# Patient Record
Sex: Female | Born: 1950
Health system: Southern US, Community
[De-identification: ages and names within clinical notes are randomized; demographics above are authoritative.]

## PROBLEM LIST (undated history)

## (undated) DIAGNOSIS — R011 Cardiac murmur, unspecified: Secondary | ICD-10-CM

## (undated) DIAGNOSIS — Z720 Tobacco use: Secondary | ICD-10-CM

## (undated) DIAGNOSIS — R5383 Other fatigue: Secondary | ICD-10-CM

## (undated) HISTORY — DX: Cardiac murmur, unspecified: R01.1

## (undated) HISTORY — DX: Tobacco use: Z72.0

## (undated) HISTORY — PX: HERNIA REPAIR: SHX51

## (undated) HISTORY — DX: Other fatigue: R53.83

---

## 2013-07-14 ENCOUNTER — Encounter: Payer: Self-pay | Admitting: Cardiovascular Disease

## 2013-07-14 ENCOUNTER — Encounter (INDEPENDENT_AMBULATORY_CARE_PROVIDER_SITE_OTHER): Payer: Self-pay

## 2013-07-14 ENCOUNTER — Ambulatory Visit (INDEPENDENT_AMBULATORY_CARE_PROVIDER_SITE_OTHER): Payer: Medicare Other | Admitting: Cardiovascular Disease

## 2013-07-14 VITALS — BP 135/79 | HR 84 | Ht 60.0 in | Wt 180.0 lb

## 2013-07-14 DIAGNOSIS — F17200 Nicotine dependence, unspecified, uncomplicated: Secondary | ICD-10-CM

## 2013-07-14 DIAGNOSIS — Z720 Tobacco use: Secondary | ICD-10-CM | POA: Insufficient documentation

## 2013-07-14 DIAGNOSIS — R011 Cardiac murmur, unspecified: Secondary | ICD-10-CM

## 2013-07-14 MED ORDER — ASPIRIN EC 81 MG PO TBEC: 81.0000 mg | DELAYED_RELEASE_TABLET | Freq: Every day | ORAL | Status: DC

## 2013-07-14 NOTE — Assessment & Plan Note (Addendum)
The patient has a cardiac murmur suggestive of aortic stenosis which seems to be mild to moderate by physical exam. I recommend an echocardiogram for evaluation. I discussed with her the natural history and management of aortic valve stenosis. I will have her followup with me in 6 months. She does have exertional dyspnea which is likely due to prolonged tobacco use. However, I will consider stress testing in the future given her risk factors for coronary artery disease. I asked her to stop aspirin 81 mg once daily.

## 2013-07-14 NOTE — Assessment & Plan Note (Signed)
I had a prolonged discussion with her about the importance of smoking cessation.

## 2013-07-14 NOTE — Progress Notes (Signed)
Primary care physician: Dr. Luan Pulling  HPI  This is a pleasant 63 year old female who was referred for evaluation of a cardiac murmur. She is not aware of any previous cardiac history and has no chronic medical conditions other than prolonged tobacco use. She smokes 2 packs per day and had been doing so for at least 40 years. She recently is established with Dr. Luan Pulling I have not seen a primary care physician prior to that since 1983.  She was noted to have a cardiac murmur by physical exam. She denies any chest pain, palpitations, dizziness or syncope. She does report prolonged history of exertional dyspnea with no recent worsening. She denies any claudication. She does have family history of coronary artery disease. Her father had myocardial infarction and CABG but she does not know the onset of age.  No Known Allergies   No current outpatient prescriptions on file prior to visit.   No current facility-administered medications on file prior to visit.     Past Medical History  Diagnosis Date  . Tobacco use      Past Surgical History  Procedure Laterality Date  . Hernia repair       Family History  Problem Relation Age of Onset  . Alzheimer's disease Mother   . Heart attack Father      History   Social History  . Marital Status: Married    Spouse Name: N/A    Number of Children: N/A  . Years of Education: N/A   Occupational History  . Not on file.   Social History Main Topics  . Smoking status: Current Every Day Smoker -- 2.00 packs/day for 40 years    Types: Cigarettes  . Smokeless tobacco: Current User    Types: Snuff  . Alcohol Use: No  . Drug Use: No  . Sexual Activity: Not on file   Other Topics Concern  . Not on file   Social History Narrative  . No narrative on file     ROS A 10 point review of system was performed. It is negative other than that mentioned in the history of present illness.   PHYSICAL EXAM   BP 135/79  Pulse 84  Ht 5'  (1.524 m)  Wt 180 lb (81.647 kg)  BMI 35.15 kg/m2 Constitutional: She is oriented to person, place, and time. She appears well-developed and well-nourished. No distress.  HENT: No nasal discharge.  Head: Normocephalic and atraumatic.  Eyes: Pupils are equal and round. No discharge.  Neck: Normal range of motion. Neck supple. No JVD present. No thyromegaly present.  Cardiovascular: Normal rate, regular rhythm, normal heart sounds. Exam reveals no gallop and no friction rub. There is a 3/6 systolic crescendo decrescendo murmur at the aortic area which is mid peaking with mildly diminished S2. Normal carotid upstroke. Pulmonary/Chest: Effort normal and breath sounds normal. No stridor. No respiratory distress. She has no wheezes. She has no rales. She exhibits no tenderness.  Abdominal: Soft. Bowel sounds are normal. She exhibits no distension. There is no tenderness. There is no rebound and no guarding.  Musculoskeletal: Normal range of motion. She exhibits no edema and no tenderness.  Neurological: She is alert and oriented to person, place, and time. Coordination normal.  Skin: Skin is warm and dry. No rash noted. She is not diaphoretic. No erythema. No pallor.  Psychiatric: She has a normal mood and affect. Her behavior is normal. Judgment and thought content normal.     [id number]  Rhythm  WITHIN NORMAL  LIMITS   ASSESSMENT AND PLAN

## 2013-07-14 NOTE — Patient Instructions (Signed)
Your physician has requested that you have an echocardiogram. Echocardiography is a painless test that uses sound waves to create images of your heart. It provides your doctor with information about the size and shape of your heart and how well your heart's chambers and valves are working. This procedure takes approximately one hour. There are no restrictions for this procedure.  Your physician has recommended you make the following change in your medication:  Start Aspirin 81 mg daily   Your physician wants you to follow-up in: 6 months. You will receive a reminder letter in the mail two months in advance. If you don't receive a letter, please call our office to schedule the follow-up appointment.

## 2013-07-16 ENCOUNTER — Ambulatory Visit: Payer: Self-pay | Admitting: Family Medicine

## 2013-08-04 ENCOUNTER — Other Ambulatory Visit (INDEPENDENT_AMBULATORY_CARE_PROVIDER_SITE_OTHER): Payer: Medicare Other

## 2013-08-04 ENCOUNTER — Other Ambulatory Visit: Payer: Self-pay

## 2013-08-04 DIAGNOSIS — R0602 Shortness of breath: Secondary | ICD-10-CM

## 2013-08-04 DIAGNOSIS — R011 Cardiac murmur, unspecified: Secondary | ICD-10-CM

## 2015-08-01 DIAGNOSIS — H2513 Age-related nuclear cataract, bilateral: Secondary | ICD-10-CM | POA: Diagnosis not present

## 2017-02-22 ENCOUNTER — Ambulatory Visit: Payer: Self-pay | Admitting: Family Medicine

## 2017-04-25 ENCOUNTER — Encounter: Payer: Self-pay | Admitting: Family Medicine

## 2017-04-25 ENCOUNTER — Ambulatory Visit (INDEPENDENT_AMBULATORY_CARE_PROVIDER_SITE_OTHER): Payer: Medicare Other | Admitting: Family Medicine

## 2017-04-25 VITALS — BP 140/64 | HR 87 | Temp 98.0°F | Resp 16 | Ht 60.25 in | Wt 172.6 lb

## 2017-04-25 DIAGNOSIS — Z1231 Encounter for screening mammogram for malignant neoplasm of breast: Secondary | ICD-10-CM | POA: Diagnosis not present

## 2017-04-25 DIAGNOSIS — K625 Hemorrhage of anus and rectum: Secondary | ICD-10-CM | POA: Diagnosis not present

## 2017-04-25 DIAGNOSIS — Z1211 Encounter for screening for malignant neoplasm of colon: Secondary | ICD-10-CM | POA: Diagnosis not present

## 2017-04-25 DIAGNOSIS — E669 Obesity, unspecified: Secondary | ICD-10-CM | POA: Diagnosis not present

## 2017-04-25 DIAGNOSIS — I350 Nonrheumatic aortic (valve) stenosis: Secondary | ICD-10-CM

## 2017-04-25 DIAGNOSIS — K644 Residual hemorrhoidal skin tags: Secondary | ICD-10-CM | POA: Diagnosis not present

## 2017-04-25 DIAGNOSIS — R7303 Prediabetes: Secondary | ICD-10-CM

## 2017-04-25 DIAGNOSIS — Z1239 Encounter for other screening for malignant neoplasm of breast: Secondary | ICD-10-CM

## 2017-04-25 DIAGNOSIS — Z720 Tobacco use: Secondary | ICD-10-CM

## 2017-04-25 DIAGNOSIS — R011 Cardiac murmur, unspecified: Secondary | ICD-10-CM | POA: Diagnosis not present

## 2017-04-25 NOTE — Assessment & Plan Note (Signed)
Offered rectal exam today; avoid constipation, drink more water, plenty of fiber

## 2017-04-25 NOTE — Assessment & Plan Note (Signed)
Encouragement given; see AVS 

## 2017-04-25 NOTE — Assessment & Plan Note (Signed)
Encouraged cessation, see AVS

## 2017-04-25 NOTE — Assessment & Plan Note (Signed)
Discussed diagnosis; discussed getting echo; murmur is mild, agree to ask again in six months; patient politely declined getting echo today; she does not want to go back to see the cardiologist right now

## 2017-04-25 NOTE — Patient Instructions (Addendum)
Steps to Quit Smoking Smoking tobacco can be bad for your health. It can also affect almost every organ in your body. Smoking puts you and people around you at risk for many serious long-lasting (chronic) diseases. Quitting smoking is hard, but it is one of the best things that you can do for your health. It is never too late to quit. What are the benefits of quitting smoking? When you quit smoking, you lower your risk for getting serious diseases and conditions. They can include:  Lung cancer or lung disease.  Heart disease.  Stroke.  Heart attack.  Not being able to have children (infertility).  Weak bones (osteoporosis) and broken bones (fractures).  If you have coughing, wheezing, and shortness of breath, those symptoms may get better when you quit. You may also get sick less often. If you are pregnant, quitting smoking can help to lower your chances of having a baby of low birth weight. What can I do to help me quit smoking? Talk with your doctor about what can help you quit smoking. Some things you can do (strategies) include:  Quitting smoking totally, instead of slowly cutting back how much you smoke over a period of time.  Going to in-person counseling. You are more likely to quit if you go to many counseling sessions.  Using resources and support systems, such as: ? Online chats with a counselor. ? Phone quitlines. ? Printed self-help materials. ? Support groups or group counseling. ? Text messaging programs. ? Mobile phone apps or applications.  Taking medicines. Some of these medicines may have nicotine in them. If you are pregnant or breastfeeding, do not take any medicines to quit smoking unless your doctor says it is okay. Talk with your doctor about counseling or other things that can help you.  Talk with your doctor about using more than one strategy at the same time, such as taking medicines while you are also going to in-person counseling. This can help make  quitting easier. What things can I do to make it easier to quit? Quitting smoking might feel very hard at first, but there is a lot that you can do to make it easier. Take these steps:  Talk to your family and friends. Ask them to support and encourage you.  Call phone quitlines, reach out to support groups, or work with a counselor.  Ask people who smoke to not smoke around you.  Avoid places that make you want (trigger) to smoke, such as: ? Bars. ? Parties. ? Smoke-break areas at work.  Spend time with people who do not smoke.  Lower the stress in your life. Stress can make you want to smoke. Try these things to help your stress: ? Getting regular exercise. ? Deep-breathing exercises. ? Yoga. ? Meditating. ? Doing a body scan. To do this, close your eyes, focus on one area of your body at a time from head to toe, and notice which parts of your body are tense. Try to relax the muscles in those areas.  Download or buy apps on your mobile phone or tablet that can help you stick to your quit plan. There are many free apps, such as QuitGuide from the CDC (Centers for Disease Control and Prevention). You can find more support from smokefree.gov and other websites.  This information is not intended to replace advice given to you by your health care provider. Make sure you discuss any questions you have with your health care provider. Document Released: 11/25/2008 Document   Revised: 09/27/2015 Document Reviewed: 06/15/2014 Elsevier Interactive Patient Education  2018 [place] Risks of Smoking Smoking cigarettes is very bad for your health. Tobacco smoke has over 200 known poisons in it. It contains the poisonous gases nitrogen oxide and carbon monoxide. There are over 60 chemicals in tobacco smoke that cause cancer. Smoking is difficult to quit because a chemical in tobacco, called nicotine, causes addiction or dependence. When you smoke and inhale, nicotine is absorbed rapidly  into the bloodstream through your lungs. Both inhaled and non-inhaled nicotine may be addictive. What are the risks of cigarette smoke? Cigarette smokers have an increased risk of many serious medical problems, including:  Lung cancer.  Lung disease, such as pneumonia, bronchitis, and emphysema.  Chest pain (angina) and heart attack because the heart is not getting enough oxygen.  Heart disease and peripheral blood vessel disease.  High blood pressure (hypertension).  Stroke.  Oral cancer, including cancer of the lip, mouth, or voice box.  Bladder cancer.  Pancreatic cancer.  Cervical cancer.  Pregnancy complications, including premature birth.  Stillbirths and smaller newborn babies, birth defects, and genetic damage to sperm.  Early menopause.  Lower estrogen level for women.  Infertility.  Facial wrinkles.  Blindness.  Increased risk of broken bones (fractures).  Senile dementia.  Stomach ulcers and internal bleeding.  Delayed wound healing and increased risk of complications during surgery.  Even smoking lightly shortens your life expectancy by several years.  Because of secondhand smoke exposure, children of smokers have an increased risk of the following:  Sudden infant death syndrome (SIDS).  Respiratory infections.  Lung cancer.  Heart disease.  Ear infections.  What are the benefits of quitting? There are many health benefits of quitting smoking. Here are some of them:  Within days of quitting smoking, your risk of having a heart attack decreases, your blood flow improves, and your lung capacity improves. Blood pressure, pulse rate, and breathing patterns start returning to normal soon after quitting.  Within months, your lungs may clear up completely.  Quitting for 10 years reduces your risk of developing lung cancer and heart disease to almost that of a nonsmoker.  People who quit may see an improvement in their overall quality of  life.  How do I quit smoking? Smoking is an addiction with both physical and psychological effects, and longtime habits can be hard to change. Your health care provider can recommend:  Programs and community resources, which may include group support, education, or talk therapy.  Prescription medicines to help reduce cravings.  Nicotine replacement products, such as patches, gum, and nasal sprays. Use these products only as directed. Do not replace cigarette smoking with electronic cigarettes, which are commonly called e-cigarettes. The safety of e-cigarettes is not known, and some may contain harmful chemicals.  A combination of two or more of these methods.  Where to find more information:  American Lung Association: www.lung.org  American Cancer Society: www.cancer.org Summary  Smoking cigarettes is very bad for your health. Cigarette smokers have an increased risk of many serious medical problems, including several cancers, heart disease, and stroke.  Smoking is an addiction with both physical and psychological effects, and longtime habits can be hard to change.  By stopping right away, you can greatly reduce the risk of medical problems for you and your family.  To help you quit smoking, your health care provider can recommend programs, community resources, prescription medicines, and nicotine replacement products such as patches, gum, and nasal  sprays. This information is not intended to replace advice given to you by your health care provider. Make sure you discuss any questions you have with your health care provider. Document Released: 03/08/2004 Document Revised: 02/03/2016 Document Reviewed: 02/03/2016 Elsevier Interactive Patient Education  2017 [place] out the information at [employer].org entitled "Nutrition for Weight Loss: What You Need to Know about Fad Diets" Try to lose between 1-2 pounds per week by taking in fewer calories and burning off more  calories You can succeed by limiting portions, limiting foods dense in calories and fat, becoming more active, and drinking 8 glasses of water a day (64 ounces) Don't skip meals, especially breakfast, as skipping meals may alter your metabolism Do not use over-the-counter weight loss pills or gimmicks that claim rapid weight loss A healthy BMI (or body mass index) is between 18.5 and 24.9 You can calculate your ideal BMI at the [place] website ClubMonet[employer].fr Try to follow the DASH guidelines (DASH stands for Dietary Approaches to Stop Hypertension). Try to limit the sodium in your diet to no more than 1,500mg  of sodium per day. Certainly try to not exceed 2,000 mg per day at the very most. Do not add salt when cooking or at the table.  Check the sodium amount on labels when shopping, and choose items lower in sodium when given a choice. Avoid or limit foods that already contain a lot of sodium. Eat a diet rich in fruits and vegetables and whole grains, and try to lose weight if overweight or obese

## 2017-04-25 NOTE — Progress Notes (Signed)
BP 140/64   Pulse 87   Temp 98 F (36.7 C) (Oral)   Resp 16   Ht 5' 0.25" (1.53 m)   Wt 172 lb 9.6 oz (78.3 kg)   LMP  (LMP Unknown)   SpO2 96%   BMI 33.43 kg/m    Subjective:    Patient ID: Megan Crawford, female    DOB: 20-Jul-1950, 67 y.o.   MRN: [id number]  HPI: Megan Crawford is a 67 y.o. female  Chief Complaint  Patient presents with  . New Patient (Initial Visit)  . Establish Care    HPI Patient is here to establish care No recent doctor visits or ER visits Her medicine list was reviewed; not even taking aspirin; recommended by Dr. Fletcher Anon, cardiologist She had a heart murmur, primary sent her to cardiologist  Never had colon cancer screening; she has blood in her stools, but says it is from a hemorrhoid because she can feel it there; she usually goes every day after her coffee; if she eats something different, that changes her bowel habits; blood is bright right and just when wiping; none in the commode; stool is regular chocolate brown  Tobacco abuse; 2 ppd for many years; quit for 4 years, then just picked back up with girls at work; she is thinking about quitting; she is buying 2 packs at a time instead of a carton at a time; first cig in the morning with coffee; first cig is in 5 minutes after waking up; last cig maybe 15-30 minutes before going to bed; no smoking in the middle of the night  No hx of high cholesterol; her A1c was borderline, then lost weight and numbers improved  Obesity; she lost about 5 pounds and numbers improved; her goal is to not gain any more; she weighs every day  Depression screen [facility] 2/9 04/25/2017  Decreased Interest 0  Down, Depressed, Hopeless 0  PHQ - 2 Score 0    Relevant past medical, surgical, family and social history reviewed Past Medical History:  Diagnosis Date  . Tobacco use    Past Surgical History:  Procedure Laterality Date  . HERNIA REPAIR  1970's   Family History  Problem Relation Age of Onset  . Alzheimer's  disease Mother   . Heart attack Father   . Cancer Father        ? prostate cancer  . Diabetes Brother   . Cancer Brother        colon and lung   Social History   Tobacco Use  . Smoking status: Current Every Day Smoker    Packs/day: 2.00    Years: 40.00    Pack years: 80.00    Types: Cigarettes  . Smokeless tobacco: Never Used  Substance Use Topics  . Alcohol use: No  . Drug use: No    Interim medical history since last visit reviewed. Allergies and medications reviewed  Review of Systems Per HPI unless specifically indicated above     Objective:    BP 140/64   Pulse 87   Temp 98 F (36.7 C) (Oral)   Resp 16   Ht 5' 0.25" (1.53 m)   Wt 172 lb 9.6 oz (78.3 kg)   LMP  (LMP Unknown)   SpO2 96%   BMI 33.43 kg/m   Wt Readings from Last 3 Encounters:  04/25/17 172 lb 9.6 oz (78.3 kg)  07/14/13 180 lb (81.6 kg)    Physical Exam  Constitutional: She appears well-developed and  well-nourished. No distress.  HENT:  Head: Normocephalic and atraumatic.  Eyes: EOM are normal. No scleral icterus.  Neck: No thyromegaly present.  Cardiovascular: Normal rate and regular rhythm.  Murmur (RUSB) heard.  Systolic murmur is present with a grade of 2/6. Pulmonary/Chest: Effort normal and breath sounds normal. No respiratory distress. She has no wheezes.  Abdominal: Soft. Bowel sounds are normal. She exhibits no distension.  Genitourinary: Rectal exam shows external hemorrhoid and internal hemorrhoid. Rectal exam shows no tenderness and anal tone normal.  Musculoskeletal: Normal range of motion. She exhibits no edema.  Neurological: She is alert. She exhibits normal muscle tone.  Skin: Skin is warm and dry. She is not diaphoretic. No pallor.  Psychiatric: She has a normal mood and affect. Her behavior is normal. Judgment and thought content normal.   No results found for this or any previous visit.    Assessment & Plan:   Problem List Items Addressed This Visit       Cardiovascular and Mediastinum   Aortic valve stenosis, mild - Primary    Discussed diagnosis; discussed getting echo; murmur is mild, agree to ask again in six months; patient politely declined getting echo today; she does not want to go back to see the cardiologist right now        Other   Tobacco use    Encouraged cessation, see AVS      Obesity (BMI 30.0-34.9)    Encouragement given; see AVS      Relevant Orders   COMPLETE METABOLIC PANEL WITH GFR   Lipid panel   Hemoglobin A1c   Heart murmur    stable      Bright red blood per rectum    Offered rectal exam today; avoid constipation, drink more water, plenty of fiber      Relevant Orders   CBC with Differential/Platelet   Ambulatory referral to Gastroenterology   Borderline diabetes    Was told by previous doctor no medicine needed, check A1c and glucose (non-fasting, cheese toast); weight loss encouraged      Relevant Orders   COMPLETE METABOLIC PANEL WITH GFR   Lipid panel   Hemoglobin A1c    Other Visit Diagnoses    Screening for breast cancer       Relevant Orders   MM Digital Screening   Screen for colon cancer       Relevant Orders   Ambulatory referral to Gastroenterology   External hemorrhoid, bleeding       Relevant Orders   Ambulatory referral to Gastroenterology       Follow up plan: Return in about 4 weeks (around 05/23/2017) for Medicare Wellness check.  An after-visit summary was printed and given to the patient at [place].  Please see the patient instructions which may contain other information and recommendations beyond what is mentioned above in the assessment and plan.  No orders of the defined types were placed in this encounter.   Orders Placed This Encounter  Procedures  . MM Digital Screening  . COMPLETE METABOLIC PANEL WITH GFR  . CBC with Differential/Platelet  . Lipid panel  . Hemoglobin A1c  . Ambulatory referral to Gastroenterology

## 2017-04-25 NOTE — Assessment & Plan Note (Signed)
Was told by previous doctor no medicine needed, check A1c and glucose (non-fasting, cheese toast); weight loss encouraged

## 2017-04-25 NOTE — Assessment & Plan Note (Signed)
stable °

## 2017-04-26 ENCOUNTER — Telehealth: Payer: Self-pay

## 2017-04-26 LAB — LIPID PANEL
CHOL/HDL RATIO: 3.1 (calc) (ref <5.0)
Cholesterol: 192 mg/dL (ref <200)
HDL: 62 mg/dL (ref >50)
LDL Cholesterol (Calc): 108 mg/dL (calc) — ABNORMAL HIGH
Non-HDL Cholesterol (Calc): 130 mg/dL (calc) — ABNORMAL HIGH (ref <130)
TRIGLYCERIDES: 117 mg/dL (ref <150)

## 2017-04-26 LAB — COMPLETE METABOLIC PANEL WITH GFR
AG Ratio: 1.7 (calc) (ref 1.0–2.5)
ALT: 17 U/L (ref 6–29)
AST: 19 U/L (ref 10–35)
Albumin: 4.2 g/dL (ref 3.6–5.1)
Alkaline phosphatase (APISO): 96 U/L (ref 33–130)
BUN: 14 mg/dL (ref 7–25)
CO2: 29 mmol/L (ref 20–32)
Calcium: 9.7 mg/dL (ref 8.6–10.4)
Chloride: 103 mmol/L (ref 98–110)
Creat: 0.82 mg/dL (ref 0.50–0.99)
GFR, EST AFRICAN AMERICAN: 85 mL/min/{1.73_m2} (ref >=60)
GFR, EST NON AFRICAN AMERICAN: 73 mL/min/{1.73_m2} (ref >=60)
GLUCOSE: 93 mg/dL (ref 65–139)
Globulin: 2.5 g/dL (calc) (ref 1.9–3.7)
Potassium: 3.9 mmol/L (ref 3.5–5.3)
Sodium: 141 mmol/L (ref 135–146)
TOTAL PROTEIN: 6.7 g/dL (ref 6.1–8.1)
Total Bilirubin: 0.4 mg/dL (ref 0.2–1.2)

## 2017-04-26 LAB — CBC WITH DIFFERENTIAL/PLATELET
Basophils Absolute: 49 cells/uL (ref 0–200)
Basophils Relative: 0.5 %
Eosinophils Absolute: 147 cells/uL (ref 15–500)
Eosinophils Relative: 1.5 %
HEMATOCRIT: 41.6 % (ref 35.0–45.0)
HEMOGLOBIN: 14.8 g/dL (ref 11.7–15.5)
LYMPHS ABS: 2391 {cells}/uL (ref 850–3900)
MCH: 34 pg — ABNORMAL HIGH (ref 27.0–33.0)
MCHC: 35.6 g/dL (ref 32.0–36.0)
MCV: 95.6 fL (ref 80.0–100.0)
MPV: 9.7 fL (ref 7.5–12.5)
Monocytes Relative: 6.3 %
Neutro Abs: 6595 cells/uL (ref 1500–7800)
Neutrophils Relative %: 67.3 %
Platelets: 251 10*3/uL (ref 140–400)
RBC: 4.35 10*6/uL (ref 3.80–5.10)
RDW: 11.8 % (ref 11.0–15.0)
Total Lymphocyte: 24.4 %
WBC mixed population: 617 cells/uL (ref 200–950)
WBC: 9.8 10*3/uL (ref 3.8–10.8)

## 2017-04-26 LAB — HEMOGLOBIN A1C
EAG (MMOL/L): 6.3 (calc)
Hgb A1c MFr Bld: 5.6 % of total Hgb (ref <5.7)
Mean Plasma Glucose: 114 (calc)

## 2017-04-26 NOTE — Telephone Encounter (Signed)
Called pt, informed her of information below. Pt immediately states that she will not start a medication. Lengthy discussion had on how pt can improve diet (eating lean meat such as [place], chicken, ect, cutting out saturated fats, butter, full dairy ect.) Also encouraged pt to begin walking daily for exercise and gradually increase. Pt gave verbal understanding. Also mailed pt education on [place] Quit line for smoking as well as information from Epic on diet and cholesterol.

## 2017-04-26 NOTE — Telephone Encounter (Signed)
-----   Message from Arnetha Courser, MD sent at 04/26/2017  3:16 PM EDT ----- Megan Crawford, please let pt know that her glucose and kidney function and liver function tests are all normal; her LDL cholesterol is a little higher than we want; does she want to buckle down on weight loss and healthy eating only, or does she want to try that and add a cholesterol pill? Let me know

## 2017-05-21 ENCOUNTER — Ambulatory Visit
Admission: RE | Admit: 2017-05-21 | Discharge: 2017-05-21 | Disposition: A | Discharge status: Home / Self Care | Payer: Medicare Other | Source: Ambulatory Visit | Attending: Family Medicine | Admitting: Family Medicine

## 2017-05-21 DIAGNOSIS — Z1239 Encounter for other screening for malignant neoplasm of breast: Secondary | ICD-10-CM

## 2017-05-21 DIAGNOSIS — Z1231 Encounter for screening mammogram for malignant neoplasm of breast: Secondary | ICD-10-CM | POA: Insufficient documentation

## 2017-06-04 ENCOUNTER — Ambulatory Visit (INDEPENDENT_AMBULATORY_CARE_PROVIDER_SITE_OTHER): Payer: Medicare Other

## 2017-06-04 VITALS — BP 100/60 | HR 68 | Temp 97.5°F | Resp 12 | Ht 60.0 in | Wt 168.4 lb

## 2017-06-04 DIAGNOSIS — Z0000 Encounter for general adult medical examination without abnormal findings: Secondary | ICD-10-CM | POA: Diagnosis not present

## 2017-06-04 DIAGNOSIS — Z23 Encounter for immunization: Secondary | ICD-10-CM | POA: Diagnosis not present

## 2017-06-04 NOTE — Patient Instructions (Addendum)
Megan Crawford , Thank you for taking time to come for your Medicare Wellness Visit. I appreciate your ongoing commitment to your health goals. Please review the following plan we discussed and let me know if I can assist you in the future.   Screening recommendations/referrals: Colorectal Screening: Scheduled for consult with Dr. Allen Norris for discussion about screening colonoscopy Mammogram: Completed 05/21/17. Repeat every year Bone Density: Declined Lung Cancer Screening: You will receive a call from Burgess Estelle, RN Hepatitis C Screening: Declined  Vision and Dental Exams: Recommended annual ophthalmology exams for early detection of glaucoma and other disorders of the eye Recommended annual dental exams for proper oral hygiene  Vaccinations: Influenza vaccine: Up to date Pneumococcal vaccine: Completed today Tdap vaccine: Declined. Please call your insurance company to determine your out of pocket expense. You may also receive this vaccine at your local pharmacy or Health Dept. Shingles vaccine: Please call your insurance company to determine your out of pocket expense for the Shingrix vaccine. You may also receive this vaccine at your local pharmacy or Health Dept.  Advanced directives: Advance directive discussed with you today. I have provided a copy for you to complete at home and have notarized. Once this is complete please bring a copy in to our office so we can scan it into your chart.  Conditions/risks identified: Recommend to drink at least 6-8 8oz glasses of water per day.  Next appointment: Please schedule your Annual Wellness Visit with your Nurse Health Advisor in one year.  Preventive Care 69 Years and Older, Female Preventive care refers to lifestyle choices and visits with your health care provider that can promote health and wellness. What does preventive care include?  A yearly physical exam. This is also called an annual well check.  Dental exams once or twice a  year.  Routine eye exams. Ask your health care provider how often you should have your eyes checked.  Personal lifestyle choices, including:  Daily care of your teeth and gums.  Regular physical activity.  Eating a healthy diet.  Avoiding tobacco and drug use.  Limiting alcohol use.  Practicing safe sex.  Taking low-dose aspirin every day.  Taking vitamin and mineral supplements as recommended by your health care provider. What happens during an annual well check? The services and screenings done by your health care provider during your annual well check will depend on your age, overall health, lifestyle risk factors, and family history of disease. Counseling  Your health care provider may ask you questions about your:  Alcohol use.  Tobacco use.  Drug use.  Emotional well-being.  Home and relationship well-being.  Sexual activity.  Eating habits.  History of falls.  Memory and ability to understand (cognition).  Work and work Statistician.  Reproductive health. Screening  You may have the following tests or measurements:  Height, weight, and BMI.  Blood pressure.  Lipid and cholesterol levels. These may be checked every 5 years, or more frequently if you are over 63 years old.  Skin check.  Lung cancer screening. You may have this screening every year starting at age 79 if you have a 30-pack-year history of smoking and currently smoke or have quit within the past 15 years.  Fecal occult blood test (FOBT) of the stool. You may have this test every year starting at age 21.  Flexible sigmoidoscopy or colonoscopy. You may have a sigmoidoscopy every 5 years or a colonoscopy every 10 years starting at age 79.  Hepatitis C blood test.  Hepatitis B blood test.  Sexually transmitted disease (STD) testing.  Diabetes screening. This is done by checking your blood sugar (glucose) after you have not eaten for a while (fasting). You may have this done every 1-3  years.  Bone density scan. This is done to screen for osteoporosis. You may have this done starting at age 67.  Mammogram. This may be done every 1-2 years. Talk to your health care provider about how often you should have regular mammograms. Talk with your health care provider about your test results, treatment options, and if necessary, the need for more tests. Vaccines  Your health care provider may recommend certain vaccines, such as:  Influenza vaccine. This is recommended every year.  Tetanus, diphtheria, and acellular pertussis (Tdap, Td) vaccine. You may need a Td booster every 10 years.  Zoster vaccine. You may need this after age 60.  Pneumococcal 13-valent conjugate (PCV13) vaccine. One dose is recommended after age 72.  Pneumococcal polysaccharide (PPSV23) vaccine. One dose is recommended after age 55. Talk to your health care provider about which screenings and vaccines you need and how often you need them. This information is not intended to replace advice given to you by your health care provider. Make sure you discuss any questions you have with your health care provider. Document Released: 02/25/2015 Document Revised: 10/19/2015 Document Reviewed: 11/30/2014 Elsevier Interactive Patient Education  2017 [place] Prevention in the Home Falls can cause injuries. They can happen to people of all ages. There are many things you can do to make your home safe and to help prevent falls. What can I do on the outside of my home?  Regularly fix the edges of walkways and driveways and fix any cracks.  Remove anything that might make you trip as you walk through a door, such as a raised step or threshold.  Trim any bushes or trees on the path to your home.  Use bright outdoor lighting.  Clear any walking paths of anything that might make someone trip, such as rocks or tools.  Regularly check to see if handrails are loose or broken. Make sure that both sides of any  steps have handrails.  Any raised decks and porches should have guardrails on the edges.  Have any leaves, snow, or ice cleared regularly.  Use sand or salt on walking paths during winter.  Clean up any spills in your garage right away. This includes oil or grease spills. What can I do in the bathroom?  Use night lights.  Install grab bars by the toilet and in the tub and shower. Do not use towel bars as grab bars.  Use non-skid mats or decals in the tub or shower.  If you need to sit down in the shower, use a plastic, non-slip stool.  Keep the floor dry. Clean up any water that spills on the floor as soon as it happens.  Remove soap buildup in the tub or shower regularly.  Attach bath mats securely with double-sided non-slip rug tape.  Do not have throw rugs and other things on the floor that can make you trip. What can I do in the bedroom?  Use night lights.  Make sure that you have a light by your bed that is easy to reach.  Do not use any sheets or blankets that are too big for your bed. They should not hang down onto the floor.  Have a firm chair that has side arms. You can use this  for support while you get dressed.  Do not have throw rugs and other things on the floor that can make you trip. What can I do in the kitchen?  Clean up any spills right away.  Avoid walking on wet floors.  Keep items that you use a lot in easy-to-reach places.  If you need to reach something above you, use a strong step stool that has a grab bar.  Keep electrical cords out of the way.  Do not use floor polish or wax that makes floors slippery. If you must use wax, use non-skid floor wax.  Do not have throw rugs and other things on the floor that can make you trip. What can I do with my stairs?  Do not leave any items on the stairs.  Make sure that there are handrails on both sides of the stairs and use them. Fix handrails that are broken or loose. Make sure that handrails are  as long as the stairways.  Check any carpeting to make sure that it is firmly attached to the stairs. Fix any carpet that is loose or worn.  Avoid having throw rugs at the top or bottom of the stairs. If you do have throw rugs, attach them to the floor with carpet tape.  Make sure that you have a light switch at the top of the stairs and the bottom of the stairs. If you do not have them, ask someone to add them for you. What else can I do to help prevent falls?  Wear shoes that:  Do not have high heels.  Have rubber bottoms.  Are comfortable and fit you well.  Are closed at the toe. Do not wear sandals.  If you use a stepladder:  Make sure that it is fully opened. Do not climb a closed stepladder.  Make sure that both sides of the stepladder are locked into place.  Ask someone to hold it for you, if possible.  Clearly mark and make sure that you can see:  Any grab bars or handrails.  First and last steps.  Where the edge of each step is.  Use tools that help you move around (mobility aids) if they are needed. These include:  Canes.  Walkers.  Scooters.  Crutches.  Turn on the lights when you go into a dark area. Replace any light bulbs as soon as they burn out.  Set up your furniture so you have a clear path. Avoid moving your furniture around.  If any of your floors are uneven, fix them.  If there are any pets around you, be aware of where they are.  Review your medicines with your doctor. Some medicines can make you feel dizzy. This can increase your chance of falling. Ask your doctor what other things that you can do to help prevent falls. This information is not intended to replace advice given to you by your health care provider. Make sure you discuss any questions you have with your health care provider. Document Released: 11/25/2008 Document Revised: 07/07/2015 Document Reviewed: 03/05/2014 Elsevier Interactive Patient Education  2017 [place] with Quitting Smoking Quitting smoking is a physical and mental challenge. You will face cravings, withdrawal symptoms, and temptation. Before quitting, work with your health care provider to make a plan that can help you cope. Preparation can help you quit and keep you from giving in. How can I cope with cravings? Cravings usually last for 5-10 minutes. If you get through it,  the craving will pass. Consider taking the following actions to help you cope with cravings:  Keep your mouth busy: ? Chew sugar-free gum. ? Suck on hard candies or a straw. ? Brush your teeth.  Keep your hands and body busy: ? Immediately change to a different activity when you feel a craving. ? Squeeze or play with a ball. ? Do an activity or a hobby, like making bead jewelry, practicing needlepoint, or working with wood. ? Mix up your normal routine. ? Take a short exercise break. Go for a quick walk or run up and down stairs. ? Spend time in public places where smoking is not allowed.  Focus on doing something kind or helpful for someone else.  Call a friend or family member to talk during a craving.  Join a support group.  Call a quit line, such as 1-800-QUIT-NOW.  Talk with your health care provider about medicines that might help you cope with cravings and make quitting easier for you.  How can I deal with withdrawal symptoms? Your body may experience negative effects as it tries to get used to not having nicotine in the system. These effects are called withdrawal symptoms. They may include:  Feeling hungrier than normal.  Trouble concentrating.  Irritability.  Trouble sleeping.  Feeling depressed.  Restlessness and agitation.  Craving a cigarette.  To manage withdrawal symptoms:  Avoid places, people, and activities that trigger your cravings.  Remember why you want to quit.  Get plenty of sleep.  Avoid coffee and other caffeinated drinks. These may worsen some of  your symptoms.  How can I handle social situations? Social situations can be difficult when you are quitting smoking, especially in the first few weeks. To manage this, you can:  Avoid parties, bars, and other social situations where people might be smoking.  Avoid alcohol.  Leave right away if you have the urge to smoke.  Explain to your family and friends that you are quitting smoking. Ask for understanding and support.  Plan activities with friends or family where smoking is not an option.  What are some ways I can cope with stress? Wanting to smoke may cause stress, and stress can make you want to smoke. Find ways to manage your stress. Relaxation techniques can help. For example:  Breathe slowly and deeply, in through your nose and out through your mouth.  Listen to soothing, relaxing music.  Talk with a family member or friend about your stress.  Light a candle.  Soak in a bath or take a shower.  Think about a peaceful place.  What are some ways I can prevent weight gain? Be aware that many people gain weight after they quit smoking. However, not everyone does. To keep from gaining weight, have a plan in place before you quit and stick to the plan after you quit. Your plan should include:  Having healthy snacks. When you have a craving, it may help to: ? Eat plain popcorn, crunchy carrots, celery, or other cut vegetables. ? Chew sugar-free gum.  Changing how you eat: ? Eat small portion sizes at meals. ? Eat 4-6 small meals throughout the day instead of 1-2 large meals a day. ? Be mindful when you eat. Do not watch television or do other things that might distract you as you eat.  Exercising regularly: ? Make time to exercise each day. If you do not have time for a long workout, do short bouts of exercise for 5-10 minutes several times  a day. ? Do some form of strengthening exercise, like weight lifting, and some form of aerobic exercise, like running or  swimming.  Drinking plenty of water or other low-calorie or no-calorie drinks. Drink 6-8 glasses of water daily, or as much as instructed by your health care provider.  Summary  Quitting smoking is a physical and mental challenge. You will face cravings, withdrawal symptoms, and temptation to smoke again. Preparation can help you as you go through these challenges.  You can cope with cravings by keeping your mouth busy (such as by chewing gum), keeping your body and hands busy, and making calls to family, friends, or a helpline for people who want to quit smoking.  You can cope with withdrawal symptoms by avoiding places where people smoke, avoiding drinks with caffeine, and getting plenty of rest.  Ask your health care provider about the different ways to prevent weight gain, avoid stress, and handle social situations. This information is not intended to replace advice given to you by your health care provider. Make sure you discuss any questions you have with your health care provider. Document Released: 01/27/2016 Document Revised: 01/27/2016 Document Reviewed: 01/27/2016 Elsevier Interactive Patient Education  [employer].

## 2017-06-04 NOTE — Progress Notes (Signed)
Subjective:   Megan Crawford is a 67 y.o. female who presents for an Initial Medicare Annual Wellness Visit.  Review of Systems    N/A  Cardiac Risk Factors include: advanced age (>13men, >49 women);obesity (BMI >30kg/m2);smoking/ tobacco exposure     Objective:    Today's Vitals   06/04/17 1451  BP: 100/60  Pulse: 68  Resp: 12  Temp: (!) 97.5 F (36.4 C)  TempSrc: Oral  SpO2: 94%  Weight: 168 lb 6.4 oz (76.4 kg)  Height: 5' (1.524 m)   Body mass index is 32.89 kg/m.  Advanced Directives 06/04/2017  Does Patient Have a Medical Advance Directive? No  Would patient like information on creating a medical advance directive? Yes (MAU/Ambulatory/Procedural Areas - Information given)    Current Medications (verified) No outpatient encounter medications on file as of 06/04/2017.   No facility-administered encounter medications on file as of 06/04/2017.     Allergies (verified) Patient has no known allergies.   Hospitalizations/ED visits and surgeries occurring within the previous 12 months:  Within the previous 12 months, pt has not underwent any surgical procedures, has not been hospitalized for any conditions and has not been treated by an emergency room clinician.  History: Past Medical History:  Diagnosis Date  . Cardiac murmur   . Fatigue   . Tobacco use    Past Surgical History:  Procedure Laterality Date  . HERNIA REPAIR  1970's   Family History  Problem Relation Age of Onset  . Alzheimer's disease Mother   . Heart attack Father   . Cancer Father        ? prostate cancer  . Healthy Sister   . Diabetes Brother   . Cancer Brother        colon and lung  . Healthy Son   . Healthy Sister   . Healthy Sister    Social History   Socioeconomic History  . Marital status: Divorced    Spouse name: Not on file  . Number of children: 2  . Years of education: Not on file  . Highest education level: 12th grade  Occupational History  . Occupation: Retired    Scientific laboratory technician  . Financial resource strain: Not hard at all  . Food insecurity:    Worry: Never true    Inability: Never true  . Transportation needs:    Medical: No    Non-medical: No  Tobacco Use  . Smoking status: Current Every Day Smoker    Packs/day: 2.00    Years: 52.00    Pack years: 104.00    Types: Cigarettes  . Smokeless tobacco: Never Used  Substance and Sexual Activity  . Alcohol use: No  . Drug use: No  . Sexual activity: Not Currently  Lifestyle  . Physical activity:    Days per week: 4 days    Minutes per session: 30 min  . Stress: Not at all  Relationships  . Social connections:    Talks on phone: Patient refused    Gets together: Patient refused    Attends religious service: Patient refused    Active member of club or organization: Patient refused    Attends meetings of clubs or organizations: Patient refused    Relationship status: Divorced  Other Topics Concern  . Not on file  Social History Narrative  . Not on file    Tobacco Counseling Ready to quit: No Counseling given: Yes   Clinical Intake:  Pre-visit preparation completed: Yes  Pain :  No/denies pain   BMI - recorded: 32.89 Nutritional Status: BMI > 30  Obese Nutritional Risks: None Diabetes: No  How often do you need to have someone help you when you read instructions, pamphlets, or other written materials from your doctor or pharmacy?: 1 - Never  Interpreter Needed?: No  Information entered by :: AEversole, LPN  Activities of Daily Living In your present state of health, do you have any difficulty performing the following activities: 06/04/2017 04/25/2017  Hearing? N N  Comment denies hearing aids -  Vision? Y N  Comment wears eyeglasses; cataracts -  Difficulty concentrating or making decisions? N N  Walking or climbing stairs? N N  Dressing or bathing? N N  Doing errands, shopping? N N  Preparing Food and eating ? N -  Comment denies dentures -  Using the Toilet? N -   In the past six months, have you accidently leaked urine? Y -  Comment stress incontinence -  Do you have problems with loss of bowel control? N -  Managing your Medications? N -  Managing your Finances? N -  Housekeeping or managing your Housekeeping? N -  Some recent data might be hidden     Immunizations and Health Maintenance Immunization History  Administered Date(s) Administered  . Influenza, High Dose Seasonal PF 12/15/2016  . Influenza-Unspecified 11/28/2016  . Pneumococcal Conjugate-13 06/04/2017   There are no preventive care reminders to display for this patient.  Patient Care Team: Lada, Satira Anis, MD as PCP - General (Family Medicine)  Indicate any recent Medical Services you may have received from other than [facility] providers in the past year (date may be approximate).     Assessment:   This is a routine wellness examination for Megan Crawford.  Hearing/Vision screen Vision Screening Comments: Sees Dr. George Ina for annual eye exams  Dietary issues and exercise activities discussed: Current Exercise Habits: Home exercise routine, Type of exercise: walking, Time (Minutes): 30, Frequency (Times/Week): 4, Weekly Exercise (Minutes/Week): 120, Intensity: Mild, Exercise limited by: None identified  Goals    . DIET - INCREASE WATER INTAKE     Recommend to drink at least 6-8 8oz glasses of water per day.      Depression Screen PHQ 2/9 Scores 06/04/2017 04/25/2017  PHQ - 2 Score 0 0  PHQ- 9 Score 0 -    Fall Risk Fall Risk  06/04/2017 04/25/2017  Falls in the past year? No No  Risk for fall due to : Impaired vision -  Risk for fall due to: Comment wears eyeglasses, cataracts -    Is the home free of loose throw rugs in walkways, pet beds, electrical cords, etc? Yes Adequate lighting to reduce risk of falls?  Yes In addition, does the patient have any of the following: Stairs in or around the home WITH handrails? Yes Grab bars in the bathroom? No  Shower chair or a  place to sit while bathing? No Use of an elevated toilet seat or a handicapped toilet? No Use of a cane, walker or w/c? No  Timed Get Up and Go Performed: Yes. Pt ambulated 10 feet within 5 sec. Gait stead-fast and without the use of an assistive device. No intervention required at this time. Fall risk prevention has been discussed.  Pt declined my offer to send Community Resource Referral to Care Guide for installation of grab bars in the shower, shower chair or an elevated toilet seat.  Cognitive Function:     6CIT Screen 06/04/2017  What  Year? 0 points  What month? 0 points  What time? 0 points  Count back from 20 0 points  Months in reverse 0 points  Repeat phrase 0 points  Total Score 0    Screening Tests Health Maintenance  Topic Date Due  . DEXA SCAN  04/26/2018 (Originally [birth year])  . TETANUS/TDAP  04/26/2018 (Originally 03/10/1969)  . Hepatitis C Screening  06/05/2018 (Originally 10/04/1950)  . COLONOSCOPY  06/19/2018 (Originally 03/09/1998)  . INFLUENZA VACCINE  09/12/2017  . MAMMOGRAM  05/22/2018  . PNA vac Low Risk Adult (2 of 2 - PPSV23) 06/05/2018    Qualifies for Shingles Vaccine? Yes. Due for Zostavax or Shingrix vaccine. Education has been provided regarding the importance of this vaccine. Pt has been advised to call her insurance company to determine her out of pocket expense. Advised she may also receive this vaccine at her local pharmacy or Health Dept. Verbalized acceptance and understanding.  Due for Tdap vaccine. Education has been provided regarding the importance of this vaccine. Pt has been advised she may receive this vaccine at her local pharmacy or Health Dept. Also advised to provide a copy of her vaccination record if she chooses to receive this vaccine at her local pharmacy. Verbalized acceptance and understanding.  Cancer Screenings: Lung: Low Dose CT Chest recommended if Age 50-80 years, 30 pack-year currently smoking OR have quit w/in 15years.  Patient does qualify. An Epic message has been sent to Burgess Estelle, RN (Oncology Nurse Navigator) regarding the possible need for this exam. Raquel Sarna will review the patient's chart to determine if the patient truly qualifies for the exam. If the patient qualifies, Raquel Sarna will order the Low Dose CT of the chest to facilitate the scheduling of this exam. Breast: Up to date on Mammogram? Yes. Completed 05/21/17. Repeat every year   Up to date of Bone Density/Dexa? No. Declined my offer to order this screening today. Education provided about the importance of this screening but still declined. States she "may consider this for next year but not right now". Colorectal: Scheduled for consult to discuss screening colonoscopy with Dr. Allen Norris on 06/18/17  Additional Screenings: Hepatitis C Screening: Declined   Plan:  I have personally reviewed and addressed the Medicare Annual Wellness questionnaire and have noted the following in the patient's chart:  A. Medical and social history B. Use of alcohol, tobacco or illicit drugs  C. Current medications and supplements D. Functional ability and status E.  Nutritional status F.  Physical activity G. Advance directives H. List of other physicians I.  Hospitalizations, surgeries, and ER visits in previous 12 months J.  [place] such as hearing and vision if needed, cognitive and depression L. Referrals and appointments  In addition, I have reviewed and discussed with patient certain preventive protocols, quality metrics, and best practice recommendations. A written personalized care plan for preventive services as well as general preventive health recommendations were provided to patient.  See attached scanned questionnaire for additional information.   Signed,  Aleatha Borer, LPN Nurse Health Advisor

## 2017-06-05 ENCOUNTER — Telehealth: Payer: Self-pay | Admitting: *Deleted

## 2017-06-05 NOTE — Telephone Encounter (Signed)
Received referral for low dose lung cancer screening CT scan. Message left at phone number listed in EMR for patient to call me back to facilitate scheduling scan.  

## 2017-06-06 ENCOUNTER — Telehealth: Payer: Self-pay | Admitting: *Deleted

## 2017-06-06 DIAGNOSIS — Z87891 Personal history of nicotine dependence: Secondary | ICD-10-CM

## 2017-06-06 DIAGNOSIS — Z122 Encounter for screening for malignant neoplasm of respiratory organs: Secondary | ICD-10-CM

## 2017-06-06 NOTE — Telephone Encounter (Signed)
Received referral for initial lung cancer screening scan. Contacted patient and obtained smoking history,(current, 104 pack year) as well as answering questions related to screening process. Patient denies signs of lung cancer such as weight loss or hemoptysis. Patient denies comorbidity that would prevent curative treatment if lung cancer were found. Patient is scheduled for shared decision making visit and CT scan on 06/25/17.

## 2017-06-13 ENCOUNTER — Telehealth: Payer: Self-pay

## 2017-06-13 NOTE — Telephone Encounter (Signed)
Pt recently had her awv completed with [facility] on 06/04/17 and is scheduled to see [facility] next year for awv on 06/06/18. It appears her appt on 06/17/17 is incorrectly scheduled with Dr. Sanda Klein. I believe the appt should be changed to reflect f/u to the awv completed on 06/04/17. To reduce confusion, could her appt be changed to correctly reflect f/u?

## 2017-06-17 ENCOUNTER — Encounter: Payer: Self-pay | Admitting: Family Medicine

## 2017-06-17 ENCOUNTER — Ambulatory Visit (INDEPENDENT_AMBULATORY_CARE_PROVIDER_SITE_OTHER): Payer: Medicare Other | Admitting: Family Medicine

## 2017-06-17 VITALS — BP 120/58 | HR 86 | Temp 98.5°F | Resp 16 | Ht 60.0 in | Wt 172.5 lb

## 2017-06-17 DIAGNOSIS — R011 Cardiac murmur, unspecified: Secondary | ICD-10-CM | POA: Diagnosis not present

## 2017-06-17 DIAGNOSIS — R7303 Prediabetes: Secondary | ICD-10-CM | POA: Diagnosis not present

## 2017-06-17 DIAGNOSIS — E669 Obesity, unspecified: Secondary | ICD-10-CM | POA: Diagnosis not present

## 2017-06-17 DIAGNOSIS — I350 Nonrheumatic aortic (valve) stenosis: Secondary | ICD-10-CM | POA: Diagnosis not present

## 2017-06-17 DIAGNOSIS — Z720 Tobacco use: Secondary | ICD-10-CM | POA: Diagnosis not present

## 2017-06-17 DIAGNOSIS — I080 Rheumatic disorders of both mitral and aortic valves: Secondary | ICD-10-CM

## 2017-06-17 NOTE — Assessment & Plan Note (Signed)
Stable murmur and c/w aortic stenosis

## 2017-06-17 NOTE — Assessment & Plan Note (Signed)
Order ECHO; reviewed last echo from 2015 with her

## 2017-06-17 NOTE — Assessment & Plan Note (Signed)
Order ECHO

## 2017-06-17 NOTE — Assessment & Plan Note (Signed)
Encouraged hydration, try to increase walking

## 2017-06-17 NOTE — Progress Notes (Signed)
Patient ID: Megan Crawford, female   DOB: [birth year], 67 y.o.   MRN: [id number]   Subjective:   Megan Crawford is a 67 y.o. female here for a complete physical exam  Tobacco; smoking all her life First cigarette of the day is 10 minutes after she opens her eyes; goes to the bathroom and warms a cup of coffee and then smokes Last cigarette of the day; usually 15 minutes before bed; never smokes in the middle of the night She did quit for 4-5 years once; cold [place] Lives alone with no smokers around; she thinks that is her biggest problem; plays games on the tablet and lays it in the [place], playing her games Reaches for cigarettes out of boredom and habit  Aortic valve stenosis; mild; she is not seeing any body for that right now  BRBPM; that was a hemorrhoid; it went away after just a day or two; no straining now with BMs; sometimes goes more than once a day  Obesity; did not walk all last week  Hx of borderline diabetes; runs in the family; tries to stay away from sweets; just coffee creamer Lab Results  Component Value Date   HGBA1C 5.6 04/25/2017    Past Medical History:  Diagnosis Date  . Cardiac murmur   . Fatigue   . Tobacco use    Past Surgical History:  Procedure Laterality Date  . HERNIA REPAIR  1970's   Family History  Problem Relation Age of Onset  . Alzheimer's disease Mother   . Heart attack Father   . Cancer Father        ? prostate cancer  . Healthy Sister   . Diabetes Brother   . Cancer Brother        colon and lung  . Healthy Son   . Healthy Sister   . Healthy Sister    Social History   Tobacco Use  . Smoking status: Current Every Day Smoker    Packs/day: 2.00    Years: 52.00    Pack years: 104.00    Types: Cigarettes  . Smokeless tobacco: Never Used  Substance Use Topics  . Alcohol use: No  . Drug use: No   Review of Systems  Objective:   Vitals:   06/17/17 1508  BP: (!) 120/58  Pulse: 86  Resp: 16  Temp: 98.5 F (36.9 C)   TempSrc: Oral  SpO2: 93%  Weight: 172 lb 8 oz (78.2 kg)  Height: 5' (1.524 m)   Body mass index is 33.69 kg/m. Wt Readings from Last 3 Encounters:  06/17/17 172 lb 8 oz (78.2 kg)  06/04/17 168 lb 6.4 oz (76.4 kg)  04/25/17 172 lb 9.6 oz (78.3 kg)   Physical Exam  Constitutional: She appears well-developed and well-nourished. No distress.  HENT:  Head: Normocephalic and atraumatic.  Eyes: EOM are normal. No scleral icterus.  Neck: No thyromegaly present.  Cardiovascular: Normal rate and regular rhythm.  Murmur heard.  Systolic murmur is present with a grade of 2/6. Over RUSB  Pulmonary/Chest: Effort normal and breath sounds normal. No respiratory distress. She has no wheezes.  Abdominal: Soft. Bowel sounds are normal. She exhibits no distension.  Musculoskeletal: Normal range of motion. She exhibits no edema.  Neurological: She is alert. She exhibits normal muscle tone.  Skin: Skin is warm and dry. She is not diaphoretic. No pallor.  Psychiatric: She has a normal mood and affect. Her behavior is normal. Judgment and thought content normal.  Assessment/Plan:   Problem List Items Addressed This Visit      Cardiovascular and Mediastinum   Mitral regurgitation and aortic stenosis    Order ECHO      Relevant Orders   ECHOCARDIOGRAM COMPLETE   Aortic valve stenosis, mild - Primary    Order ECHO; reviewed last echo from 2015 with her        Other   Tobacco use    Patient is not ready to quit; appreciate her honesty; I am here if/when ready      Obesity (BMI 30.0-34.9)    Encouraged hydration, try to increase walking      Heart murmur    Stable murmur and c/w aortic stenosis      Borderline diabetes    Last A1c reviewed; avoiding lots of bread and sweets; encouraged walking and weight loss          No orders of the defined types were placed in this encounter.  Orders Placed This Encounter  Procedures  . ECHOCARDIOGRAM COMPLETE    Standing Status:    Future    Standing Expiration Date:   09/18/2018    Order Specific Question:   Where should this test be performed    Answer:   [facility]    Order Specific Question:   Please indicate who you request to read the echo results.    Answer:   [facility]    Order Specific Question:   Perflutren DEFINITY (image enhancing agent) should be administered unless hypersensitivity or allergy exist    Answer:   Administer Perflutren    Order Specific Question:   Expected Date:    Answer:   1 week    Follow up plan: Return in about 1 year (around 06/18/2018) for follow-up visit with Dr. Sanda Klein.  An After Visit Summary was printed and given to the patient.

## 2017-06-17 NOTE — Assessment & Plan Note (Signed)
Last A1c reviewed; avoiding lots of bread and sweets; encouraged walking and weight loss

## 2017-06-17 NOTE — Assessment & Plan Note (Signed)
Patient is not ready to quit; appreciate her honesty; I am here if/when ready

## 2017-06-17 NOTE — Patient Instructions (Addendum)
Please call [phone] to schedule your echocardiogram Please wait 2-3 days after the order has been placed to call and get your echocardiogram scheduled  I do encourage you to quit smoking Call [phone] to sign up for smoking cessation classes You can call 1-800-QUIT-NOW to talk with a smoking cessation coach  Steps to Quit Smoking Smoking tobacco can be bad for your health. It can also affect almost every organ in your body. Smoking puts you and people around you at risk for many serious long-lasting (chronic) diseases. Quitting smoking is hard, but it is one of the best things that you can do for your health. It is never too late to quit. What are the benefits of quitting smoking? When you quit smoking, you lower your risk for getting serious diseases and conditions. They can include:  Lung cancer or lung disease.  Heart disease.  Stroke.  Heart attack.  Not being able to have children (infertility).  Weak bones (osteoporosis) and broken bones (fractures).  If you have coughing, wheezing, and shortness of breath, those symptoms may get better when you quit. You may also get sick less often. If you are pregnant, quitting smoking can help to lower your chances of having a baby of low birth weight. What can I do to help me quit smoking? Talk with your doctor about what can help you quit smoking. Some things you can do (strategies) include:  Quitting smoking totally, instead of slowly cutting back how much you smoke over a period of time.  Going to in-person counseling. You are more likely to quit if you go to many counseling sessions.  Using resources and support systems, such as: ? Database administrator with a Social worker. ? Phone quitlines. ? Careers information officer. ? Support groups or group counseling. ? Text messaging programs. ? Mobile phone apps or applications.  Taking medicines. Some of these medicines may have nicotine in them. If you are pregnant or breastfeeding,  do not take any medicines to quit smoking unless your doctor says it is okay. Talk with your doctor about counseling or other things that can help you.  Talk with your doctor about using more than one strategy at the same time, such as taking medicines while you are also going to in-person counseling. This can help make quitting easier. What things can I do to make it easier to quit? Quitting smoking might feel very hard at first, but there is a lot that you can do to make it easier. Take these steps:  Talk to your family and friends. Ask them to support and encourage you.  Call phone quitlines, reach out to support groups, or work with a Social worker.  Ask people who smoke to not smoke around you.  Avoid places that make you want (trigger) to smoke, such as: ? Bars. ? Parties. ? Smoke-break areas at work.  Spend time with people who do not smoke.  Lower the stress in your life. Stress can make you want to smoke. Try these things to help your stress: ? Getting regular exercise. ? Deep-breathing exercises. ? Yoga. ? Meditating. ? Doing a body scan. To do this, close your eyes, focus on one area of your body at a time from head to toe, and notice which parts of your body are tense. Try to relax the muscles in those areas.  Download or buy apps on your mobile phone or tablet that can help you stick to your quit plan. There are many free apps, such as  QuitGuide from the CDC [employer] for Disease Control and Prevention). You can find more support from smokefree.gov and other websites.  This information is not intended to replace advice given to you by your health care provider. Make sure you discuss any questions you have with your health care provider. Document Released: 11/25/2008 Document Revised: 09/27/2015 Document Reviewed: 06/15/2014 Elsevier Interactive Patient Education  2018 [employer].

## 2017-06-18 ENCOUNTER — Ambulatory Visit: Payer: Medicare Other | Admitting: Gastroenterology

## 2017-06-18 ENCOUNTER — Other Ambulatory Visit: Payer: Self-pay

## 2017-06-18 ENCOUNTER — Encounter: Payer: Self-pay | Admitting: Gastroenterology

## 2017-06-18 VITALS — BP 129/66 | HR 83 | Ht 60.0 in | Wt 171.2 lb

## 2017-06-18 DIAGNOSIS — Z1211 Encounter for screening for malignant neoplasm of colon: Secondary | ICD-10-CM

## 2017-06-18 NOTE — Progress Notes (Signed)
Gastroenterology Consultation  Referring Provider:     Arnetha Courser, MD Primary Care Physician:  Arnetha Courser, MD Primary Gastroenterologist:  Dr. Allen Norris     Reason for Consultation:     Screening colonoscopy        HPI:   Megan Crawford is a 67 y.o. y/o female referred for consultation & management of Screening colonoscopy by Dr. Sanda Klein, Satira Anis, MD.  This patient comes today because she wanted to meet me prior to having a screening colonoscopy.  The patient states that her brother was diagnosed with colon cancer at the age of 62.  He had done well with his surgery and treatment.  The patient has a history of hemorrhoids and had some bleeding from her hemorrhoids but that has stopped and she is now seeking guidance for a screening colonoscopy.  Past Medical History:  Diagnosis Date  . Cardiac murmur   . Fatigue   . Tobacco use     Past Surgical History:  Procedure Laterality Date  . HERNIA REPAIR  1970's    Prior to Admission medications   Not on File    Family History  Problem Relation Age of Onset  . Alzheimer's disease Mother   . Heart attack Father   . Cancer Father        ? prostate cancer  . Healthy Sister   . Diabetes Brother   . Cancer Brother        colon and lung  . Healthy Son   . Healthy Sister   . Healthy Sister      Social History   Tobacco Use  . Smoking status: Current Every Day Smoker    Packs/day: 2.00    Years: 52.00    Pack years: 104.00    Types: Cigarettes  . Smokeless tobacco: Never Used  Substance Use Topics  . Alcohol use: No  . Drug use: No    Allergies as of 06/18/2017  . (No Known Allergies)    Review of Systems:    All systems reviewed and negative except where noted in HPI.   Physical Exam:  BP 129/66   Pulse 83   Ht 5' (1.524 m)   Wt 171 lb 3.2 oz (77.7 kg)   LMP  (LMP Unknown)   BMI 33.44 kg/m  No LMP recorded (lmp unknown). Patient is postmenopausal. Psych:  Alert and cooperative. Normal mood and  affect. General:   Alert,  Well-developed, well-nourished, pleasant and cooperative in NAD Head:  Normocephalic and atraumatic. Eyes:  Sclera clear, no icterus.   Conjunctiva pink. Neurologic:  Alert and oriented x3;  grossly normal neurologically. Skin:  Intact without significant lesions or rashes.  No jaundice. Psych:  Alert and cooperative. Normal mood and affect.  Imaging Studies: Mm Digital Screening  Result Date: 05/21/2017 CLINICAL DATA:  Screening. EXAM: DIGITAL SCREENING BILATERAL MAMMOGRAM WITH CAD COMPARISON:  Previous exam(s). ACR Breast Density Category a: The breast tissue is almost entirely fatty. FINDINGS: There are no findings suspicious for malignancy. Images were processed with CAD. IMPRESSION: No mammographic evidence of malignancy. A result letter of this screening mammogram will be mailed directly to the patient. RECOMMENDATION: Screening mammogram in one year. (Code:SM-B-01Y) BI-RADS CATEGORY  1: Negative. Electronically Signed   By: Nolon Nations M.D.   On: 05/21/2017 15:53    Assessment and Plan:   Megan Crawford is a 67 y.o. y/o female who comes in today to be me prior to having a screening  colonoscopy.  The patient has been doing well and has no complaints the present time.  The patient will be set up for screening colonoscopy.  [name], MD. [name]   Note: This dictation was prepared with Dragon dictation along with smaller phrase technology. Any transcriptional errors that result from this process are unintentional.

## 2017-06-19 ENCOUNTER — Other Ambulatory Visit: Payer: Self-pay

## 2017-06-19 DIAGNOSIS — Z1211 Encounter for screening for malignant neoplasm of colon: Secondary | ICD-10-CM

## 2017-06-24 ENCOUNTER — Encounter: Payer: Self-pay | Admitting: *Deleted

## 2017-06-25 ENCOUNTER — Encounter: Payer: Self-pay | Admitting: *Deleted

## 2017-06-25 ENCOUNTER — Ambulatory Visit: Payer: Medicare Other | Admitting: Certified Registered"

## 2017-06-25 ENCOUNTER — Ambulatory Visit: Payer: Medicare Other

## 2017-06-25 ENCOUNTER — Encounter
Admission: RE | Disposition: A | Discharge status: Home / Self Care | Payer: Self-pay | Source: Ambulatory Visit | Attending: Gastroenterology

## 2017-06-25 ENCOUNTER — Ambulatory Visit
Admission: RE | Admit: 2017-06-25 | Discharge: 2017-06-25 | Disposition: A | Discharge status: Home / Self Care | Payer: Medicare Other | Source: Ambulatory Visit | Attending: Gastroenterology | Admitting: Gastroenterology

## 2017-06-25 ENCOUNTER — Ambulatory Visit: Payer: Medicare Other | Admitting: Oncology

## 2017-06-25 DIAGNOSIS — D125 Benign neoplasm of sigmoid colon: Secondary | ICD-10-CM

## 2017-06-25 DIAGNOSIS — K648 Other hemorrhoids: Secondary | ICD-10-CM | POA: Diagnosis not present

## 2017-06-25 DIAGNOSIS — K635 Polyp of colon: Secondary | ICD-10-CM

## 2017-06-25 DIAGNOSIS — Z1211 Encounter for screening for malignant neoplasm of colon: Secondary | ICD-10-CM

## 2017-06-25 DIAGNOSIS — F17210 Nicotine dependence, cigarettes, uncomplicated: Secondary | ICD-10-CM | POA: Diagnosis not present

## 2017-06-25 DIAGNOSIS — K640 First degree hemorrhoids: Secondary | ICD-10-CM | POA: Insufficient documentation

## 2017-06-25 HISTORY — PX: COLONOSCOPY WITH PROPOFOL: SHX5780

## 2017-06-25 SURGERY — COLONOSCOPY WITH PROPOFOL
Anesthesia: General

## 2017-06-25 MED ORDER — PROPOFOL 10 MG/ML IV BOLUS
INTRAVENOUS | Status: DC | PRN
  Administered 2017-06-25: 100 mg via INTRAVENOUS

## 2017-06-25 MED ORDER — LIDOCAINE HCL (CARDIAC) PF 100 MG/5ML IV SOSY
PREFILLED_SYRINGE | INTRAVENOUS | Status: DC | PRN
  Administered 2017-06-25: 80 mg via INTRAVENOUS

## 2017-06-25 MED ORDER — PROPOFOL 500 MG/50ML IV EMUL
INTRAVENOUS | Status: DC | PRN
  Administered 2017-06-25: 75 ug/kg/min via INTRAVENOUS

## 2017-06-25 MED ORDER — LIDOCAINE HCL (PF) 1 % IJ SOLN
INTRAMUSCULAR | Status: AC
  Administered 2017-06-25: 0.3 mL
  Filled 2017-06-25: qty 2

## 2017-06-25 MED ORDER — SODIUM CHLORIDE 0.9 % IV SOLN
INTRAVENOUS | Status: DC
  Administered 2017-06-25: 1000 mL via INTRAVENOUS

## 2017-06-25 MED ORDER — GLYCOPYRROLATE 0.2 MG/ML IJ SOLN
INTRAMUSCULAR | Status: DC | PRN
  Administered 2017-06-25 (×2): 0.2 mg via INTRAVENOUS

## 2017-06-25 NOTE — Transfer of Care (Signed)
Immediate Anesthesia Transfer of Care Note  Patient: GEOVANNA SIMKO  Procedure(s) Performed: COLONOSCOPY WITH PROPOFOL (N/A )  Patient Location: PACU  Anesthesia Type:General  Level of Consciousness: awake, alert  and oriented  Airway & Oxygen Therapy: Patient Spontanous Breathing and Patient connected to nasal cannula oxygen  Post-op Assessment: Report given to RN and Post -op Vital signs reviewed and stable  Post vital signs: Reviewed and stable  Last Vitals:  Vitals Value Taken Time  BP    Temp    Pulse 72 06/25/2017 11:00 AM  Resp 20 06/25/2017 11:00 AM  SpO2 97 % 06/25/2017 11:00 AM  Vitals shown include unvalidated device data.  Last Pain:  Vitals:   06/25/17 0952  TempSrc: Tympanic  PainSc: 0-No pain         Complications: No apparent anesthesia complications

## 2017-06-25 NOTE — Anesthesia Preprocedure Evaluation (Signed)
Anesthesia Evaluation  Patient identified by MRN, date of birth, ID band Patient awake    Reviewed: Allergy & Precautions, H&P , NPO status , Patient's Chart, lab work & pertinent test results, reviewed documented beta blocker date and time   History of Anesthesia Complications Negative for: history of anesthetic complications  Airway Mallampati: I  TM Distance: >3 FB Neck ROM: full    Dental  (+) Caps, Dental Advidsory Given   Pulmonary neg shortness of breath, neg COPD, neg recent URI, Current Smoker,           Cardiovascular Exercise Tolerance: Good (-) angina(-) CAD, (-) Past MI, (-) Cardiac Stents and (-) CABG (-) dysrhythmias + Valvular Problems/Murmurs      Neuro/Psych negative neurological ROS  negative psych ROS   GI/Hepatic negative GI ROS, Neg liver ROS,   Endo/Other  negative endocrine ROS  Renal/GU negative Renal ROS  negative genitourinary   Musculoskeletal   Abdominal   Peds  Hematology negative hematology ROS (+)   Anesthesia Other Findings Past Medical History: No date: Cardiac murmur No date: Fatigue No date: Tobacco use   Reproductive/Obstetrics negative OB ROS                             Anesthesia Physical Anesthesia Plan  ASA: II  Anesthesia Plan: General   Post-op Pain Management:    Induction: Intravenous  PONV Risk Score and Plan: 2 and Propofol infusion  Airway Management Planned: Nasal Cannula  Additional Equipment:   Intra-op Plan:   Post-operative Plan:   Informed Consent: I have reviewed the patients History and Physical, chart, labs and discussed the procedure including the risks, benefits and alternatives for the proposed anesthesia with the patient or authorized representative who has indicated his/her understanding and acceptance.   Dental Advisory Given  Plan Discussed with: Anesthesiologist, CRNA and Surgeon  Anesthesia Plan  Comments:         Anesthesia Quick Evaluation

## 2017-06-25 NOTE — Op Note (Signed)
[facility] Patient Name: Quentin Strebel Procedure Date: 06/25/2017 10:30 AM MRN: [id number] Account #: [id number] Date of Birth: June 09, 1950 Admit Type: Outpatient Age: 67 Room: [facility] ENDO ROOM 4 Gender: Female Note Status: Finalized Procedure:            Colonoscopy Indications:          Screening for colorectal malignant neoplasm Providers:            [name] MD, MD Referring MD:         Arnetha Courser (Referring MD) Medicines:            Propofol per Anesthesia Complications:        No immediate complications. Procedure:            Pre-Anesthesia Assessment:                       - Prior to the procedure, a History and Physical was                        performed, and patient medications and allergies were                        reviewed. The patient's tolerance of previous                        anesthesia was also reviewed. The risks and benefits of                        the procedure and the sedation options and risks were                        discussed with the patient. All questions were                        answered, and informed consent was obtained. Prior                        Anticoagulants: The patient has taken no previous                        anticoagulant or antiplatelet agents. ASA Grade                        Assessment: II - A patient with mild systemic disease.                        After reviewing the risks and benefits, the patient was                        deemed in satisfactory condition to undergo the                        procedure.                       After obtaining informed consent, the colonoscope was                        passed under direct vision. Throughout the procedure,  the patient's blood pressure, pulse, and oxygen                        saturations were monitored continuously. The                        Colonoscope was introduced through the anus and      advanced to the the cecum, identified by appendiceal                        orifice and ileocecal valve. The colonoscopy was                        performed without difficulty. The patient tolerated the                        procedure well. The quality of the bowel preparation                        was excellent. Findings:      The perianal and digital rectal examinations were normal.      Three sessile polyps were found in the sigmoid colon. The polyps were 2       to 4 mm in size. These polyps were removed with a cold snare. Resection       and retrieval were complete.      Non-bleeding internal hemorrhoids were found during retroflexion. The       hemorrhoids were Grade I (internal hemorrhoids that do not prolapse). Impression:           - Three 2 to 4 mm polyps in the sigmoid colon, removed                        with a cold snare. Resected and retrieved.                       - Non-bleeding internal hemorrhoids. Recommendation:       - Discharge patient to home.                       - Resume previous diet.                       - Continue present medications.                       - Await pathology results.                       - Repeat colonoscopy in 5 years if polyp adenoma and 10                        years if hyperplastic Procedure Code(s):    --- Professional ---                       [phone], Colonoscopy, flexible; with removal of tumor(s),                        polyp(s), or other lesion(s) by snare technique Diagnosis Code(s):    --- Professional ---  Z12.11, Encounter for screening for malignant neoplasm                        of colon                       D12.5, Benign neoplasm of sigmoid colon CPT copyright 2017 American Medical Association. All rights reserved. The codes documented in this report are preliminary and upon coder review may  be revised to meet current compliance requirements. [name] MD, MD 06/25/2017 10:51:17 AM This  report has been signed electronically. Number of Addenda: 0 Note Initiated On: 06/25/2017 10:30 AM Scope Withdrawal Time: 0 hours 6 minutes 38 seconds  Total Procedure Duration: 0 hours 14 minutes 50 seconds       [facility]

## 2017-06-25 NOTE — Anesthesia Post-op Follow-up Note (Signed)
Anesthesia QCDR form completed.        

## 2017-06-25 NOTE — H&P (Signed)
[name], MD [address] [place],  [address] Phone: [phone] Fax : [phone]  Primary Care Physician:  Arnetha Courser, MD Primary Gastroenterologist:  Dr. Allen Norris  Pre-Procedure History & Physical: HPI:  Megan Crawford is a 67 y.o. female is here for a screening colonoscopy.   Past Medical History:  Diagnosis Date  . Cardiac murmur   . Fatigue   . Tobacco use     Past Surgical History:  Procedure Laterality Date  . HERNIA REPAIR  1970's    Prior to Admission medications   Not on File    Allergies as of 06/19/2017  . (No Known Allergies)    Family History  Problem Relation Age of Onset  . Alzheimer's disease Mother   . Heart attack Father   . Cancer Father        ? prostate cancer  . Healthy Sister   . Diabetes Brother   . Cancer Brother        colon and lung  . Healthy Son   . Healthy Sister   . Healthy Sister     Social History   Socioeconomic History  . Marital status: Divorced    Spouse name: Not on file  . Number of children: 2  . Years of education: Not on file  . Highest education level: 12th grade  Occupational History  . Occupation: Retired  Scientific laboratory technician  . Financial resource strain: Not hard at all  . Food insecurity:    Worry: Never true    Inability: Never true  . Transportation needs:    Medical: No    Non-medical: No  Tobacco Use  . Smoking status: Current Every Day Smoker    Packs/day: 2.00    Years: 52.00    Pack years: 104.00    Types: Cigarettes  . Smokeless tobacco: Never Used  Substance and Sexual Activity  . Alcohol use: No  . Drug use: No  . Sexual activity: Not Currently  Lifestyle  . Physical activity:    Days per week: 4 days    Minutes per session: 30 min  . Stress: Not at all  Relationships  . Social connections:    Talks on phone: Patient refused    Gets together: Patient refused    Attends religious service: Patient refused    Active member of club or organization:  Patient refused    Attends meetings of clubs or organizations: Patient refused    Relationship status: Divorced  . Intimate partner violence:    Fear of current or ex partner: No    Emotionally abused: No    Physically abused: No    Forced sexual activity: No  Other Topics Concern  . Not on file  Social History Narrative  . Not on file    Review of Systems: See HPI, otherwise negative ROS  Physical Exam: BP 138/63   Pulse (!) 47   Temp (!) 96.6 F (35.9 C) (Tympanic)   Resp 18   Ht 5\' 1"  (1.549 m)   Wt 163 lb (73.9 kg)   LMP  (LMP Unknown)   SpO2 97%   BMI 30.80 kg/m  General:   Alert,  pleasant and cooperative in NAD Head:  Normocephalic and atraumatic. Neck:  Supple; no masses or thyromegaly. Lungs:  Clear throughout to auscultation.    Heart:  Regular rate and rhythm. Abdomen:  Soft, nontender and nondistended. Normal bowel sounds, without guarding, and without rebound.   Neurologic:  Alert  and  oriented x4;  grossly normal neurologically.  Impression/Plan: Megan Crawford is now here to undergo a screening colonoscopy.  Risks, benefits, and alternatives regarding colonoscopy have been reviewed with the patient.  Questions have been answered.  All parties agreeable.

## 2017-06-25 NOTE — Anesthesia Postprocedure Evaluation (Signed)
Anesthesia Post Note  Patient: Megan Crawford  Procedure(s) Performed: COLONOSCOPY WITH PROPOFOL (N/A )  Patient location during evaluation: Endoscopy Anesthesia Type: General Level of consciousness: awake and alert Pain management: pain level controlled Vital Signs Assessment: post-procedure vital signs reviewed and stable Respiratory status: spontaneous breathing, nonlabored ventilation, respiratory function stable and patient connected to nasal cannula oxygen Cardiovascular status: blood pressure returned to baseline and stable Postop Assessment: no apparent nausea or vomiting Anesthetic complications: no     Last Vitals:  Vitals:   06/25/17 1111 06/25/17 1121  BP: (!) 102/53 (!) 112/57  Pulse:    Resp:    Temp:    SpO2:      Last Pain:  Vitals:   06/25/17 1121  TempSrc:   PainSc: 0-No pain                 Martha Clan

## 2017-06-26 ENCOUNTER — Encounter: Payer: Self-pay | Admitting: Gastroenterology

## 2017-06-26 LAB — SURGICAL PATHOLOGY

## 2017-06-27 ENCOUNTER — Encounter: Payer: Self-pay | Admitting: Gastroenterology

## 2017-07-02 ENCOUNTER — Ambulatory Visit
Admission: RE | Admit: 2017-07-02 | Discharge: 2017-07-02 | Disposition: A | Discharge status: Home / Self Care | Payer: Medicare Other | Source: Ambulatory Visit | Attending: Oncology | Admitting: Oncology

## 2017-07-02 ENCOUNTER — Inpatient Hospital Stay: Payer: Medicare Other | Attending: Oncology | Admitting: Oncology

## 2017-07-02 DIAGNOSIS — Z87891 Personal history of nicotine dependence: Secondary | ICD-10-CM

## 2017-07-02 DIAGNOSIS — J439 Emphysema, unspecified: Secondary | ICD-10-CM | POA: Insufficient documentation

## 2017-07-02 DIAGNOSIS — Z122 Encounter for screening for malignant neoplasm of respiratory organs: Secondary | ICD-10-CM | POA: Insufficient documentation

## 2017-07-02 DIAGNOSIS — I700 Atherosclerosis of aorta: Secondary | ICD-10-CM | POA: Diagnosis not present

## 2017-07-02 NOTE — Progress Notes (Signed)
In accordance with CMS guidelines, patient has met eligibility criteria including age, absence of signs or symptoms of lung cancer.  Social History   Tobacco Use  . Smoking status: Current Every Day Smoker    Packs/day: 2.00    Years: 52.00    Pack years: 104.00    Types: Cigarettes  . Smokeless tobacco: Never Used  Substance Use Topics  . Alcohol use: No  . Drug use: No     A shared decision-making session was conducted prior to the performance of CT scan. This includes one or more decision aids, includes benefits and harms of screening, follow-up diagnostic testing, over-diagnosis, false positive rate, and total radiation exposure.  Counseling on the importance of adherence to annual lung cancer LDCT screening, impact of co-morbidities, and ability or willingness to undergo diagnosis and treatment is imperative for compliance of the program.  Counseling on the importance of continued smoking cessation for former smokers; the importance of smoking cessation for current smokers, and information about tobacco cessation interventions have been given to patient including [place] and 1800 quit [place] programs.  Written order for lung cancer screening with LDCT has been given to the patient and any and all questions have been answered to the best of my abilities.   Yearly follow up will be coordinated by Burgess Estelle, Thoracic Navigator.  Faythe Casa, NP 07/02/2017 1:14 PM

## 2017-07-05 ENCOUNTER — Encounter: Payer: Self-pay | Admitting: *Deleted

## 2017-11-15 DIAGNOSIS — H2513 Age-related nuclear cataract, bilateral: Secondary | ICD-10-CM | POA: Diagnosis not present

## 2018-06-20 ENCOUNTER — Other Ambulatory Visit: Payer: Self-pay

## 2018-06-20 ENCOUNTER — Ambulatory Visit (INDEPENDENT_AMBULATORY_CARE_PROVIDER_SITE_OTHER): Payer: Medicare Other | Admitting: Nurse Practitioner

## 2018-06-20 ENCOUNTER — Encounter: Payer: Self-pay | Admitting: Nurse Practitioner

## 2018-06-20 VITALS — BP 130/75 | Ht 60.0 in | Wt 171.6 lb

## 2018-06-20 DIAGNOSIS — I700 Atherosclerosis of aorta: Secondary | ICD-10-CM | POA: Insufficient documentation

## 2018-06-20 DIAGNOSIS — E669 Obesity, unspecified: Secondary | ICD-10-CM | POA: Diagnosis not present

## 2018-06-20 DIAGNOSIS — E785 Hyperlipidemia, unspecified: Secondary | ICD-10-CM | POA: Diagnosis not present

## 2018-06-20 DIAGNOSIS — Z716 Tobacco abuse counseling: Secondary | ICD-10-CM

## 2018-06-20 DIAGNOSIS — J439 Emphysema, unspecified: Secondary | ICD-10-CM

## 2018-06-20 DIAGNOSIS — Z720 Tobacco use: Secondary | ICD-10-CM | POA: Diagnosis not present

## 2018-06-20 NOTE — Progress Notes (Signed)
Virtual Visit via Telephone Note  I connected with Megan Crawford  on 06/20/18 at  9:00 AM EDT by telephone and verified that I am speaking with the correct person using two identifiers.   Staff discussed the limitations, risks, security and privacy concerns of performing an evaluation and management service by telephone and the availability of in person appointments. Staff also discussed with the patient that there may be a patient responsible charge related to this service. The patient expressed understanding and agreed to proceed.  Patients location: home My location: home office  Other people in meeting: none   HPI  Patient presents for routine follow up  States wakes up in the morning has a cup of coffee. Has two meals a day- mostly vegetables, states last night cooked some pork loins and states she will eat a bit of that over the next few days. Eats green beans, pears, potatoes. Doesn't eat many sandwiches, avoids lunch meats. Drinks a lot of sweet tea but not much water except in the water.  States walks at least 3 times a week, at least 30 minutes. She is outside in the garden a few times a week.   Daily bowel movements, denies urinary continence   Emphysema  Smokes 2 ppd. States she is trying to quit but not good at it.  Denies shortness of breath or wheezing. Endorses daily morning productive cough.   Hyperlipidemia Lab Results  Component Value Date   CHOL 192 04/25/2017   HDL 62 04/25/2017   LDLCALC 108 (H) 04/25/2017   TRIG 117 04/25/2017   CHOLHDL 3.1 04/25/2017   The 10-year ASCVD risk score Mikey Bussing DC Jr., et al., 2013) is: 15.1%   Values used to calculate the score:     Age: 18 years     Sex: Female     Is Non-Hispanic African American: No     Diabetic: No     Tobacco smoker: Yes     Systolic Blood Pressure: 628 mmHg     Is BP treated: No     HDL Cholesterol: 62 mg/dL     Total Cholesterol: 192 mg/dL  She has had borderline diabetes in the past, last A1C was  improved.  Lab Results  Component Value Date   HGBA1C 5.6 04/25/2017     PHQ2/9: Depression screen [facility] 2/9 06/17/2017 06/04/2017 04/25/2017  Decreased Interest 0 0 0  Down, Depressed, Hopeless 0 0 0  PHQ - 2 Score 0 0 0  Altered sleeping - 0 -  Tired, decreased energy - 0 -  Change in appetite - 0 -  Feeling bad or failure about yourself  - 0 -  Trouble concentrating - 0 -  Moving slowly or fidgety/restless - 0 -  Suicidal thoughts - 0 -  PHQ-9 Score - 0 -  Difficult doing work/chores - Not difficult at all -    PHQ reviewed. Negative  Patient Active Problem List   Diagnosis Date Noted  . Colon cancer screening   . Polyp of sigmoid colon   . Mitral regurgitation and aortic stenosis 06/17/2017  . Aortic valve stenosis, mild 04/25/2017  . Obesity (BMI 30.0-34.9) 04/25/2017  . Bright red blood per rectum 04/25/2017  . Borderline diabetes 04/25/2017  . Heart murmur 07/14/2013  . Tobacco use     Past Medical History:  Diagnosis Date  . Cardiac murmur   . Fatigue   . Tobacco use     Past Surgical History:  Procedure Laterality Date  . COLONOSCOPY  WITH PROPOFOL N/A 06/25/2017   Procedure: COLONOSCOPY WITH PROPOFOL;  Surgeon: [name], MD;  Location: [facility] ENDOSCOPY;  Service: Endoscopy;  Laterality: N/A;  . HERNIA REPAIR  1970's    Social History   Tobacco Use  . Smoking status: Current Every Day Smoker    Packs/day: 2.00    Years: 52.00    Pack years: 104.00    Types: Cigarettes  . Smokeless tobacco: Never Used  Substance Use Topics  . Alcohol use: No    No current outpatient medications on file.  No Known Allergies  Review of Systems  Constitutional: Negative for chills, fever and malaise/fatigue.  HENT: Negative for congestion, sinus pain and sore throat.   Eyes: Negative for blurred vision.  Respiratory: Positive for cough (chronic morning cough). Negative for shortness of breath.   Cardiovascular: Negative for chest pain, palpitations and leg  swelling.  Gastrointestinal: Negative for abdominal pain, constipation, diarrhea and nausea.  Genitourinary: Negative for dysuria.  Musculoskeletal: Negative for falls and joint pain.  Skin: Negative for rash.  Neurological: Negative for dizziness and headaches.  Endo/Heme/Allergies: Negative for polydipsia.  Psychiatric/Behavioral: The patient is not nervous/anxious and does not have insomnia.       No other specific complaints in a complete review of systems (except as listed in HPI above).  Objective  Vitals:   06/20/18 0852  Weight: 171 lb 9.6 oz (77.8 kg)  Height: 5' (1.524 m)     Body mass index is 33.51 kg/m.  Patient is alert, able to speak in full sentences without difficulty.   Assessment & Plan  1. Hyperlipidemia, unspecified hyperlipidemia type Discussed diet in detail.   2. Obesity (BMI 30.0-34.9) Diet and exercise   3. Tobacco use Plans to quit  4. Encounter for tobacco use cessation counseling States is going to try cold [place], does not want medication assistance, gave her resource line, discussed cutting down  5. Aortic atherosclerosis (HCC) Declined statin   6. Pulmonary emphysema, unspecified emphysema type ([place]) Declined daily inhaler or prn inhaler   I discussed the assessment and treatment plan with the patient. The patient was provided an opportunity to ask questions and all were answered. The patient agreed with the plan and demonstrated an understanding of the instructions.   The patient was advised to call back or seek an in-person evaluation if the symptoms worsen or if the condition fails to improve as anticipated.  I provided 23 minutes of non-face-to-face time during this encounter.   Fredderick Severance, NP

## 2018-06-20 NOTE — Patient Instructions (Signed)

## 2018-07-29 ENCOUNTER — Telehealth: Payer: Self-pay | Admitting: *Deleted

## 2018-07-29 DIAGNOSIS — Z87891 Personal history of nicotine dependence: Secondary | ICD-10-CM

## 2018-07-29 DIAGNOSIS — Z122 Encounter for screening for malignant neoplasm of respiratory organs: Secondary | ICD-10-CM

## 2018-07-29 NOTE — Telephone Encounter (Signed)
Patient has been notified that annual lung cancer screening low dose CT scan is due currently or will be in near future. Confirmed that patient is within the age range of 55-77, and asymptomatic, (no signs or symptoms of lung cancer). Patient denies illness that would prevent curative treatment for lung cancer if found. Verified smoking history, (current, 106 pack year). The shared decision making visit was done 07/02/17. Patient is agreeable for CT scan being scheduled.

## 2018-08-01 ENCOUNTER — Other Ambulatory Visit: Payer: Self-pay

## 2018-08-01 ENCOUNTER — Ambulatory Visit
Admission: RE | Admit: 2018-08-01 | Discharge: 2018-08-01 | Disposition: A | Discharge status: Home / Self Care | Payer: Medicare Other | Source: Ambulatory Visit | Attending: Oncology | Admitting: Oncology

## 2018-08-01 DIAGNOSIS — Z87891 Personal history of nicotine dependence: Secondary | ICD-10-CM | POA: Insufficient documentation

## 2018-08-01 DIAGNOSIS — Z122 Encounter for screening for malignant neoplasm of respiratory organs: Secondary | ICD-10-CM | POA: Insufficient documentation

## 2018-08-01 DIAGNOSIS — Z136 Encounter for screening for cardiovascular disorders: Secondary | ICD-10-CM | POA: Diagnosis not present

## 2018-08-04 ENCOUNTER — Encounter: Payer: Self-pay | Admitting: *Deleted

## 2018-09-12 ENCOUNTER — Ambulatory Visit (INDEPENDENT_AMBULATORY_CARE_PROVIDER_SITE_OTHER): Payer: Medicare Other

## 2018-09-12 VITALS — BP 150/72 | Ht 60.0 in | Wt 170.0 lb

## 2018-09-12 DIAGNOSIS — Z1231 Encounter for screening mammogram for malignant neoplasm of breast: Secondary | ICD-10-CM | POA: Diagnosis not present

## 2018-09-12 DIAGNOSIS — Z780 Asymptomatic menopausal state: Secondary | ICD-10-CM | POA: Diagnosis not present

## 2018-09-12 DIAGNOSIS — Z0000 Encounter for general adult medical examination without abnormal findings: Secondary | ICD-10-CM

## 2018-09-12 NOTE — Progress Notes (Signed)
Subjective:   Megan Crawford is a 68 y.o. female who presents for Medicare Annual (Subsequent) preventive examination.  Virtual Visit via Telephone Note  I connected with Megan Crawford on 09/12/18 at  1:30 PM EDT by telephone and verified that I am speaking with the correct person using two identifiers.  Medicare Annual Wellness visit completed telephonically due to Covid-19 pandemic.   Location: Patient: home  Provider: office   I discussed the limitations, risks, security and privacy concerns of performing an evaluation and management service by telephone and the availability of in person appointments. The patient expressed understanding and agreed to proceed.  Some vital signs may be absent or patient reported.   Megan Marker, LPN  Review of Systems:   Cardiac Risk Factors include: advanced age (>28men, >30 women);smoking/ tobacco exposure;obesity (BMI >30kg/m2)     Objective:     Vitals: BP (!) 150/72   Ht 5' (1.524 m)   Wt 170 lb (77.1 kg)   LMP  (LMP Unknown)   BMI 33.20 kg/m   Body mass index is 33.2 kg/m.  Advanced Directives 09/12/2018 06/25/2017 06/04/2017  Does Patient Have a Medical Advance Directive? No No No  Would patient like information on creating a medical advance directive? No - Patient declined - Yes (MAU/Ambulatory/Procedural Areas - Information given)    Tobacco Social History   Tobacco Use  Smoking Status Current Every Day Smoker  . Packs/day: 2.00  . Years: 52.00  . Pack years: 104.00  . Types: Cigarettes  Smokeless Tobacco Never Used  Tobacco Comment   pt declines information about quitting     Ready to quit: No Counseling given: No Comment: pt declines information about quitting   Clinical Intake:  Pre-visit preparation completed: Yes  Pain : No/denies pain     BMI - recorded: 33.2 Nutritional Status: BMI > 30  Obese Nutritional Risks: None Diabetes: No  How often do you need to have someone help you when you read  instructions, pamphlets, or other written materials from your doctor or pharmacy?: 1 - Never  Interpreter Needed?: No  Information entered by :: Megan Marker LPN  Past Medical History:  Diagnosis Date  . Cardiac murmur   . Fatigue   . Tobacco use    Past Surgical History:  Procedure Laterality Date  . COLONOSCOPY WITH PROPOFOL N/A 06/25/2017   Procedure: COLONOSCOPY WITH PROPOFOL;  Surgeon: [name], MD;  Location: [facility] ENDOSCOPY;  Service: Endoscopy;  Laterality: N/A;  . HERNIA REPAIR  1970's   Family History  Problem Relation Age of Onset  . Alzheimer's disease Mother   . Heart attack Father   . Cancer Father        ? prostate cancer  . Healthy Sister   . Diabetes Brother   . Cancer Brother        colon and lung  . Healthy Son   . Healthy Sister   . Healthy Sister    Social History   Socioeconomic History  . Marital status: Divorced    Spouse name: Not on file  . Number of children: 2  . Years of education: Not on file  . Highest education level: 12th grade  Occupational History  . Occupation: Retired  Scientific laboratory technician  . Financial resource strain: Not hard at all  . Food insecurity    Worry: Never true    Inability: Never true  . Transportation needs    Medical: No    Non-medical: No  Tobacco  Use  . Smoking status: Current Every Day Smoker    Packs/day: 2.00    Years: 52.00    Pack years: 104.00    Types: Cigarettes  . Smokeless tobacco: Never Used  . Tobacco comment: pt declines information about quitting  Substance and Sexual Activity  . Alcohol use: No  . Drug use: No  . Sexual activity: Not Currently    Partners: Male  Lifestyle  . Physical activity    Days per week: 3 days    Minutes per session: 30 min  . Stress: Not at all  Relationships  . Social Herbalist on phone: Twice a week    Gets together: Once a week    Attends religious service: More than 4 times per year    Active member of club or organization: No    Attends  meetings of clubs or organizations: Never    Relationship status: Divorced  Other Topics Concern  . Not on file  Social History Narrative  . Not on file    No outpatient encounter medications on file as of 09/12/2018.   No facility-administered encounter medications on file as of 09/12/2018.     Activities of Daily Living In your present state of health, do you have any difficulty performing the following activities: 09/12/2018 06/20/2018  Hearing? N N  Comment declines hearing aids -  Vision? N N  Difficulty concentrating or making decisions? N N  Walking or climbing stairs? N N  Dressing or bathing? N N  Doing errands, shopping? N N  Preparing Food and eating ? N -  Using the Toilet? N -  In the past six months, have you accidently leaked urine? N -  Do you have problems with loss of bowel control? N -  Managing your Medications? N -  Managing your Finances? N -  Housekeeping or managing your Housekeeping? N -  Some recent data might be hidden    Patient Care Team: Megan Crawford, Satira Anis, MD as PCP - General (Family Medicine)    Assessment:   This is a routine wellness examination for Megan Crawford.  Exercise Activities and Dietary recommendations Current Exercise Habits: Home exercise routine, Type of exercise: walking, Time (Minutes): 30, Frequency (Times/Week): 3, Weekly Exercise (Minutes/Week): 90, Intensity: Mild, Exercise limited by: None identified  Goals    . DIET - INCREASE WATER INTAKE     Recommend to drink at least 6-8 8oz glasses of water per day.       Fall Risk Fall Risk  09/12/2018 06/20/2018 06/17/2017 06/04/2017 04/25/2017  Falls in the past year? 0 1 No No No  Number falls in past yr: 0 0 - - -  Injury with Fall? 0 0 - - -  Risk for fall due to : - - - Impaired vision -  Risk for fall due to: Comment - - - wears eyeglasses, cataracts -  Follow up Falls prevention discussed - - - -   FALL RISK PREVENTION PERTAINING TO THE HOME:  Any stairs in or around the home?  Yes  If so, do they handrails? Yes   Home free of loose throw rugs in walkways, pet beds, electrical cords, etc? Yes  Adequate lighting in your home to reduce risk of falls? Yes   ASSISTIVE DEVICES UTILIZED TO PREVENT FALLS:  Life alert? No  Use of a cane, walker or w/c? No  Grab bars in the bathroom? No  Shower chair or bench in shower? No  Elevated  toilet seat or a handicapped toilet? No   DME ORDERS:  DME order needed?  No   TIMED UP AND GO:  Was the test performed? No . Telephonic visit.   Education: Fall risk prevention has been discussed.  Intervention(s) required? No   Depression Screen PHQ 2/9 Scores 09/12/2018 06/20/2018 06/17/2017 06/04/2017  PHQ - 2 Score 0 1 0 0  PHQ- 9 Score 1 1 - 0     Cognitive Function     6CIT Screen 09/12/2018 06/04/2017  What Year? 0 points 0 points  What month? 0 points 0 points  What time? 0 points 0 points  Count back from 20 0 points 0 points  Months in reverse 0 points 0 points  Repeat phrase 0 points 0 points  Total Score 0 0    Immunization History  Administered Date(s) Administered  . Influenza, High Dose Seasonal PF 12/15/2016, 12/07/2017  . Influenza-Unspecified 11/28/2016  . Pneumococcal Conjugate-13 06/04/2017    Qualifies for Shingles Vaccine? Yes . Due for Shingrix. Education has been provided regarding the importance of this vaccine. Pt has been advised to call insurance company to determine out of pocket expense. Advised may also receive vaccine at local pharmacy or Health Dept. Verbalized acceptance and understanding.  Tdap: Although this vaccine is not a covered service during a Wellness Exam, does the patient still wish to receive this vaccine today?  No .  Education has been provided regarding the importance of this vaccine. Advised may receive this vaccine at local pharmacy or Health Dept. Aware to provide a copy of the vaccination record if obtained from local pharmacy or Health Dept. Verbalized acceptance and  understanding.  Flu Vaccine: Up to date  Pneumococcal Vaccine: Due for Pneumococcal vaccine. Does the patient want to receive this vaccine today?  No . Education has been provided regarding the importance of this vaccine but still declined. Advised may receive this vaccine at local pharmacy or Health Dept. Aware to provide a copy of the vaccination record if obtained from local pharmacy or Health Dept. Verbalized acceptance and understanding.   Screening Tests Health Maintenance  Topic Date Due  . DEXA SCAN  08/28/48  . Hepatitis C Screening  09-20-1950  . TETANUS/TDAP  03/10/1967  . MAMMOGRAM  05/22/2018  . PNA vac Low Risk Adult (2 of 2 - PPSV23) 06/05/2018  . INFLUENZA VACCINE  09/13/2018  . COLONOSCOPY  06/26/2027    Cancer Screenings:  Colorectal Screening: Completed 06/25/17. Repeat every 10 years;   Mammogram: Completed 05/21/17. Repeat every year. Ordered today. Pt provided with contact information and advised to call to schedule appt.   Bone Density: Not completed. Ordered today. Pt provided with contact information and advised to call to schedule appt.   Lung Cancer Screening: (Low Dose CT Chest recommended if Age 34-80 years, 30 pack-year currently smoking OR have quit w/in 15years.) does qualify. Lung can Additional Screening:  Hepatitis C Screening: does qualify; postponed  Vision Screening: Recommended annual ophthalmology exams for early detection of glaucoma and other disorders of the eye. Is the patient up to date with their annual eye exam?  Yes  Who is the provider or what is the name of the office in which the pt attends annual eye exams? [place] Screening: Recommended annual dental exams for proper oral hygiene  Community Resource Referral:  CRR required this visit?  No      Plan:    I have personally reviewed and addressed the Medicare Annual  Wellness questionnaire and have noted the following in the patient's chart:  A. Medical  and social history B. Use of alcohol, tobacco or illicit drugs  C. Current medications and supplements D. Functional ability and status E.  Nutritional status F.  Physical activity G. Advance directives H. List of other physicians I.  Hospitalizations, surgeries, and ER visits in previous 12 months J.  Lane such as hearing and vision if needed, cognitive and depression L. Referrals and appointments   In addition, I have reviewed and discussed with patient certain preventive protocols, quality metrics, and best practice recommendations. A written personalized care plan for preventive services as well as general preventive health recommendations were provided to patient.   Signed,  Megan Marker, LPN Nurse Health Advisor   Nurse Notes: pt doing well and appreciative of visit today

## 2018-09-12 NOTE — Patient Instructions (Signed)
Ms. Megan Crawford , Thank you for taking time to come for your Medicare Wellness Visit. I appreciate your ongoing commitment to your health goals. Please review the following plan we discussed and let me know if I can assist you in the future.   Screening recommendations/referrals: Colonoscopy: done 06/25/17. Repeat in 2029 Mammogram: done 05/21/17. Please call [phone] to schedule your mammogram and bone density screening.   Bone Density: due Recommended yearly ophthalmology/optometry visit for glaucoma screening and checkup Recommended yearly dental visit for hygiene and checkup  Vaccinations: Influenza vaccine: done 12/07/17 Pneumococcal vaccine: done 06/04/17; due for second dose Tdap vaccine: due - please contact us if you get a cut or scrape Shingles vaccine: Shingrix discussed. Please contact your pharmacy for coverage information.   Advanced directives: Advance directive discussed with you today. Even though you declined this today please call our office should you change your mind and we can give you the proper paperwork for you to fill out.  Conditions/risks identified: If you wish to quit smoking, help is available. For free tobacco cessation program offerings call the [facility] at (661)714-1653 or Live Well Line at 405-568-4393. You may also visit [email or web address] or email [email or web address] for more information on other programs.    Next appointment: Please follow up in one year for your Medicare Annual Wellness visit.     Preventive Care 60 Years and Older, Female Preventive care refers to lifestyle choices and visits with your health care provider that can promote health and wellness. What does preventive care include?  A yearly physical exam. This is also called an annual well check.  Dental exams once or twice a year.  Routine eye exams. Ask your health care provider how often you should have your eyes checked.  Personal lifestyle choices,  including:  Daily care of your teeth and gums.  Regular physical activity.  Eating a healthy diet.  Avoiding tobacco and drug use.  Limiting alcohol use.  Practicing safe sex.  Taking low-dose aspirin every day.  Taking vitamin and mineral supplements as recommended by your health care provider. What happens during an annual well check? The services and screenings done by your health care provider during your annual well check will depend on your age, overall health, lifestyle risk factors, and family history of disease. Counseling  Your health care provider may ask you questions about your:  Alcohol use.  Tobacco use.  Drug use.  Emotional well-being.  Home and relationship well-being.  Sexual activity.  Eating habits.  History of falls.  Memory and ability to understand (cognition).  Work and work Statistician.  Reproductive health. Screening  You may have the following tests or measurements:  Height, weight, and BMI.  Blood pressure.  Lipid and cholesterol levels. These may be checked every 5 years, or more frequently if you are over 65 years old.  Skin check.  Lung cancer screening. You may have this screening every year starting at age 86 if you have a 30-pack-year history of smoking and currently smoke or have quit within the past 15 years.  Fecal occult blood test (FOBT) of the stool. You may have this test every year starting at age 5.  Flexible sigmoidoscopy or colonoscopy. You may have a sigmoidoscopy every 5 years or a colonoscopy every 10 years starting at age 16.  Hepatitis C blood test.  Hepatitis B blood test.  Sexually transmitted disease (STD) testing.  Diabetes screening. This is done by checking your blood sugar (glucose) after  you have not eaten for a while (fasting). You may have this done every 1-3 years.  Bone density scan. This is done to screen for osteoporosis. You may have this done starting at age 64.  Mammogram. This  may be done every 1-2 years. Talk to your health care provider about how often you should have regular mammograms. Talk with your health care provider about your test results, treatment options, and if necessary, the need for more tests. Vaccines  Your health care provider may recommend certain vaccines, such as:  Influenza vaccine. This is recommended every year.  Tetanus, diphtheria, and acellular pertussis (Tdap, Td) vaccine. You may need a Td booster every 10 years.  Zoster vaccine. You may need this after age 63.  Pneumococcal 13-valent conjugate (PCV13) vaccine. One dose is recommended after age 67.  Pneumococcal polysaccharide (PPSV23) vaccine. One dose is recommended after age 8. Talk to your health care provider about which screenings and vaccines you need and how often you need them. This information is not intended to replace advice given to you by your health care provider. Make sure you discuss any questions you have with your health care provider. Document Released: 02/25/2015 Document Revised: 10/19/2015 Document Reviewed: 11/30/2014 Elsevier Interactive Patient Education  2017 [place] Prevention in the Home Falls can cause injuries. They can happen to people of all ages. There are many things you can do to make your home safe and to help prevent falls. What can I do on the outside of my home?  Regularly fix the edges of walkways and driveways and fix any cracks.  Remove anything that might make you trip as you walk through a door, such as a raised step or threshold.  Trim any bushes or trees on the path to your home.  Use bright outdoor lighting.  Clear any walking paths of anything that might make someone trip, such as rocks or tools.  Regularly check to see if handrails are loose or broken. Make sure that both sides of any steps have handrails.  Any raised decks and porches should have guardrails on the edges.  Have any leaves, snow, or ice cleared  regularly.  Use sand or salt on walking paths during winter.  Clean up any spills in your garage right away. This includes oil or grease spills. What can I do in the bathroom?  Use night lights.  Install grab bars by the toilet and in the tub and shower. Do not use towel bars as grab bars.  Use non-skid mats or decals in the tub or shower.  If you need to sit down in the shower, use a plastic, non-slip stool.  Keep the floor dry. Clean up any water that spills on the floor as soon as it happens.  Remove soap buildup in the tub or shower regularly.  Attach bath mats securely with double-sided non-slip rug tape.  Do not have throw rugs and other things on the floor that can make you trip. What can I do in the bedroom?  Use night lights.  Make sure that you have a light by your bed that is easy to reach.  Do not use any sheets or blankets that are too big for your bed. They should not hang down onto the floor.  Have a firm chair that has side arms. You can use this for support while you get dressed.  Do not have throw rugs and other things on the floor that can make you trip.  What can I do in the kitchen?  Clean up any spills right away.  Avoid walking on wet floors.  Keep items that you use a lot in easy-to-reach places.  If you need to reach something above you, use a strong step stool that has a grab bar.  Keep electrical cords out of the way.  Do not use floor polish or wax that makes floors slippery. If you must use wax, use non-skid floor wax.  Do not have throw rugs and other things on the floor that can make you trip. What can I do with my stairs?  Do not leave any items on the stairs.  Make sure that there are handrails on both sides of the stairs and use them. Fix handrails that are broken or loose. Make sure that handrails are as long as the stairways.  Check any carpeting to make sure that it is firmly attached to the stairs. Fix any carpet that is loose  or worn.  Avoid having throw rugs at the top or bottom of the stairs. If you do have throw rugs, attach them to the floor with carpet tape.  Make sure that you have a light switch at the top of the stairs and the bottom of the stairs. If you do not have them, ask someone to add them for you. What else can I do to help prevent falls?  Wear shoes that:  Do not have high heels.  Have rubber bottoms.  Are comfortable and fit you well.  Are closed at the toe. Do not wear sandals.  If you use a stepladder:  Make sure that it is fully opened. Do not climb a closed stepladder.  Make sure that both sides of the stepladder are locked into place.  Ask someone to hold it for you, if possible.  Clearly mark and make sure that you can see:  Any grab bars or handrails.  First and last steps.  Where the edge of each step is.  Use tools that help you move around (mobility aids) if they are needed. These include:  Canes.  Walkers.  Scooters.  Crutches.  Turn on the lights when you go into a dark area. Replace any light bulbs as soon as they burn out.  Set up your furniture so you have a clear path. Avoid moving your furniture around.  If any of your floors are uneven, fix them.  If there are any pets around you, be aware of where they are.  Review your medicines with your doctor. Some medicines can make you feel dizzy. This can increase your chance of falling. Ask your doctor what other things that you can do to help prevent falls. This information is not intended to replace advice given to you by your health care provider. Make sure you discuss any questions you have with your health care provider. Document Released: 11/25/2008 Document Revised: 07/07/2015 Document Reviewed: 03/05/2014 Elsevier Interactive Patient Education  2017 [employer].

## 2018-10-28 ENCOUNTER — Other Ambulatory Visit: Payer: Self-pay

## 2018-10-28 ENCOUNTER — Ambulatory Visit (INDEPENDENT_AMBULATORY_CARE_PROVIDER_SITE_OTHER): Payer: Medicare Other

## 2018-10-28 DIAGNOSIS — Z23 Encounter for immunization: Secondary | ICD-10-CM | POA: Diagnosis not present

## 2019-07-27 ENCOUNTER — Telehealth: Payer: Self-pay

## 2019-07-27 NOTE — Telephone Encounter (Signed)
Message left notifying patient that it is time to schedule the low dose lung cancer screening CT scan.  Instructed patient to return call to Shawn Perkins at [phone] to verify information prior to CT scan being scheduled.    

## 2019-07-31 ENCOUNTER — Telehealth: Payer: Self-pay | Admitting: *Deleted

## 2019-07-31 DIAGNOSIS — Z122 Encounter for screening for malignant neoplasm of respiratory organs: Secondary | ICD-10-CM

## 2019-07-31 DIAGNOSIS — Z87891 Personal history of nicotine dependence: Secondary | ICD-10-CM

## 2019-07-31 NOTE — Telephone Encounter (Signed)
(  07/31/2019) Pt has been notified that lung cancer screening CT scan is due currently or will be in near future. Confirmed pt is within appropriate age range, and asymptomatic. Pt denies illness that would prevent curative treatment for lung cancer if found. Verified smoking history (Current Smoker, 2 ppd ). Pt did receive 2nd COVID VX on (03/30/19) [CT to be scheduled approx. 4 weeks after vx date] Pt is agreeable for CT scan being scheduled, prefers appt on a Mon, Tues, Weds, or Fri; no time preference. SRW

## 2019-08-10 NOTE — Telephone Encounter (Signed)
Smoking history: current, 108 pack year

## 2019-08-10 NOTE — Addendum Note (Signed)
Addended by: Lieutenant Diego on: 08/10/2019 04:41 PM   Modules accepted: Orders

## 2019-08-18 ENCOUNTER — Ambulatory Visit: Payer: Medicare Other

## 2019-09-15 ENCOUNTER — Ambulatory Visit: Payer: Medicare Other

## 2019-11-17 ENCOUNTER — Telehealth: Payer: Self-pay

## 2019-11-17 NOTE — Telephone Encounter (Signed)
Route to the wrong office.

## 2019-11-17 NOTE — Telephone Encounter (Signed)
Pt called back saying she is waiting for a call from the office to schd an appt with a doctor in the [facility].  She use to see Dr. Sanda Klein but was told she will need to see Dr. Roxan Hockey.  She needs an OV with Dr. Lemmie Evens and then a AMV.  CB#  [phone]

## 2019-11-17 NOTE — Telephone Encounter (Signed)
Source Subject Topic  Megan Crawford (Patient) Megan Crawford (Patient) Quick Communication - See Telephone Encounter  Summary: FU with pt as past appt she states were cancelled due to lack of dr visits? resch  CRM for notification. See Telephone encounter for: 11/17/19.pls reach out to pt to sch Medicare wellness visit . Pt states that she was scheduled in August and that the office called her and said that it had been so long since she had seen her dr that a dr visit would have to be done before AWV. I was assuming that maybe both will be sch together. Please Fu with pt as hesitant to resch as she said office had canceled visits not her. She is willing to be sch for both at this point. FU at [phone]

## 2019-11-18 ENCOUNTER — Encounter: Payer: Self-pay | Admitting: Internal Medicine

## 2019-11-18 ENCOUNTER — Ambulatory Visit (INDEPENDENT_AMBULATORY_CARE_PROVIDER_SITE_OTHER): Payer: Medicare Other | Admitting: Internal Medicine

## 2019-11-18 ENCOUNTER — Other Ambulatory Visit: Payer: Self-pay

## 2019-11-18 VITALS — BP 144/60 | HR 84 | Temp 98.3°F | Resp 16 | Ht 60.0 in | Wt 169.7 lb

## 2019-11-18 DIAGNOSIS — E669 Obesity, unspecified: Secondary | ICD-10-CM | POA: Diagnosis not present

## 2019-11-18 DIAGNOSIS — J439 Emphysema, unspecified: Secondary | ICD-10-CM

## 2019-11-18 DIAGNOSIS — E785 Hyperlipidemia, unspecified: Secondary | ICD-10-CM

## 2019-11-18 DIAGNOSIS — I700 Atherosclerosis of aorta: Secondary | ICD-10-CM | POA: Diagnosis not present

## 2019-11-18 DIAGNOSIS — Z720 Tobacco use: Secondary | ICD-10-CM

## 2019-11-18 DIAGNOSIS — R030 Elevated blood-pressure reading, without diagnosis of hypertension: Secondary | ICD-10-CM

## 2019-11-18 NOTE — Progress Notes (Signed)
Patient ID: Megan Crawford, female    DOB: 07-25-1950, 69 y.o.   MRN: [id number]  PCP: Towanda Malkin, MD  Chief Complaint  Patient presents with  . Follow-up    Subjective:   Megan Crawford is a 69 y.o. female, presents to clinic with CC of the following:  Chief Complaint  Patient presents with  . Follow-up    HPI:  Patient is a 69 year old female Last visit at [facility] was in May 2020 with a telemedicine visit Follows up today  All in all, no complaints Presents as could not do wellness visit unless seen again by a provider,   Emphysema/tobacco dependence Smokes 2 ppd, has no desire to quit  denies shortness of breath or wheezing. Endorses daily morning productive cough.  Declined any inhalers offered on her last visit, still does not feel needed CT for lung cancer screening was done in June 2020 and unremarkable, follow-up ordered for June 2021 although not obtained, due to cost concern  Aortic atherosclerosis -noted on CT Declined a statin offer in the past Denies any recent chest pains, palpitations, shortness of breath, increased lower extremity swelling.  She has no history of hypertension when asked.  No recent increased headaches or vision changes  Hyperlipidemia Medication regimen-none Recent Labs       Lab Results  Component Value Date   CHOL 192 04/25/2017   HDL 62 04/25/2017   LDLCALC 108 (H) 04/25/2017   TRIG 117 04/25/2017   CHOLHDL 3.1 04/25/2017      H/o borderline diabetes She notes never been diabetic Recent Labs       Lab Results  Component Value Date   HGBA1C 5.6 04/25/2017     Recent Labs       Lab Results  Component Value Date   LDLCALC 108 (H) 04/25/2017   CREATININE 0.82 04/25/2017     Obesity Wt Readings from Last 3 Encounters:  11/18/19 169 lb 11.2 oz (77 kg)  09/12/18 170 lb (77.1 kg)  08/01/18 171 lb (77.6 kg)   Very stable weight in the recent past.        Patient Active Problem List    Diagnosis Date Noted  . Aortic atherosclerosis ([place]) 06/20/2018  . Colon cancer screening   . Polyp of sigmoid colon   . Mitral regurgitation and aortic stenosis 06/17/2017  . Aortic valve stenosis, mild 04/25/2017  . Obesity (BMI 30.0-34.9) 04/25/2017  . Bright red blood per rectum 04/25/2017  . Borderline diabetes 04/25/2017  . Heart murmur 07/14/2013  . Tobacco use      No current outpatient medications on file.   No Known Allergies   Past Surgical History:  Procedure Laterality Date  . COLONOSCOPY WITH PROPOFOL N/A 06/25/2017   Procedure: COLONOSCOPY WITH PROPOFOL;  Surgeon: [name], MD;  Location[facility] ENDOSCOPY;  Service: Endoscopy;  Laterality: N/A;  . HERNIA REPAIR  1970's     Family History  Problem Relation Age of Onset  . Alzheimer's disease Mother   . Heart attack Father   . Cancer Father        ? prostate cancer  . Healthy Sister   . Diabetes Brother   . Cancer Brother        colon and lung  . Healthy Son   . Healthy Sister   . Healthy Sister      Social History   Tobacco Use  . Smoking status: Current Every Day Smoker    Packs/day: 2.00  Years: 52.00    Pack years: 104.00    Types: Cigarettes  . Smokeless tobacco: Never Used  . Tobacco comment: pt declines information about quitting  Substance Use Topics  . Alcohol use: No    With staff assistance, above reviewed with the patient today.  ROS: As per HPI, otherwise no specific complaints on a limited and focused system review   No results found for this or any previous visit (from the past 72 hour(s)).   PHQ2/9: Depression screen [facility] 2/9 11/18/2019 09/12/2018 06/20/2018 06/17/2017 06/04/2017  Decreased Interest - 0 0 0 0  Down, Depressed, Hopeless 0 0 1 0 0  PHQ - 2 Score 0 0 1 0 0  Altered sleeping - 0 0 - 0  Tired, decreased energy - 1 0 - 0  Change in appetite - 0 0 - 0  Feeling bad or failure about yourself  - 0 0 - 0  Trouble concentrating - 0 0 - 0  Moving slowly or  fidgety/restless - 0 0 - 0  Suicidal thoughts - 0 0 - 0  PHQ-9 Score - 1 1 - 0  Difficult doing work/chores - Not difficult at all Not difficult at all - Not difficult at all   PHQ-2/9 Result is neg  Fall Risk: Fall Risk  11/18/2019 09/12/2018 06/20/2018 06/17/2017 06/04/2017  Falls in the past year? 0 0 1 No No  Number falls in past yr: 0 0 0 - -  Injury with Fall? 0 0 0 - -  Risk for fall due to : - - - - Impaired vision  Risk for fall due to: Comment - - - - wears eyeglasses, cataracts  Follow up - Falls prevention discussed - - -      Objective:   Vitals:   11/18/19 1412  BP: (!) 144/60  Pulse: 84  Resp: 16  Temp: 98.3 F (36.8 C)  TempSrc: Oral  SpO2: 98%  Weight: 169 lb 11.2 oz (77 kg)  Height: 5' (1.524 m)    Body mass index is 33.14 kg/m. No h/o HTN Rechecked by myself was 134/64 on the right with an adult cuff Physical Exam   NAD, masked, pleasant HEENT - [place]/AT, sclera anicteric, positive glasses, PERRL, EOMI, conj - non-inj'ed, TM's and canals clear, pharynx clear Neck - supple, no adenopathy, no TM, carotids 2+ and = without bruits bilat Car - RRR without m/g/r Pulm- RR and effort normal at rest, CTA without wheeze or rales Abd - soft, mildly obese, NT, ND, BS+,  no masses, no obvious HSM Back - no CVA tenderness Skin- no rash noted on exposed areas,  Ext - no LE edema,  Neuro/psychiatric - affect was not flat, appropriate with conversation  Alert and oriented  Grossly non-focal - good strength on testing extremities, sensation intact to LT in distal extremities, DTRs 2+ and equal in the patella, Romberg negative, no pronator drift, good finger-to-nose, good balance on 1 foot, good tandem walk, gait normal in the office on limited assessment  Speech normal   Results for orders placed or performed during the hospital encounter of 06/25/17  Surgical pathology  Result Value Ref Range   SURGICAL PATHOLOGY      Surgical Pathology CASE: [id number] PATIENT:  Konrad Felix Surgical Pathology Report     SPECIMEN SUBMITTED: A. Colon polyp x3, sigmoid; cold snare  CLINICAL HISTORY: None provided  PRE-OPERATIVE DIAGNOSIS: Screening Z12.11  POST-OPERATIVE DIAGNOSIS: Colon polyps, hemorrhoids     DIAGNOSIS: A.  COLON POLYP  X 3, SIGMOID; COLD SNARE: - HYPERPLASTIC POLYPS, 3 FRAGMENTS. - NEGATIVE FOR DYSPLASIA AND MALIGNANCY.   GROSS DESCRIPTION: A. Labeled: Sigmoid colon polyp cold snare x3 Received: In formalin Tissue fragment(s): 3 Size: 0.1-0.3 cm Description: Pink fragments Entirely submitted in one cassette.   Final Diagnosis performed by Bryan Lemma, MD.   Electronically signed 06/26/2017 6:24:58PM The electronic signature indicates that the named Attending Pathologist has evaluated the specimen  Technical component performed at [facility], [name] [place], [place] [address] Lab: [phone] Dir: Rush Farmer, MD, MMM  Professional component performed  at [place], [facility], [place], [place], [place] [address] Lab: [phone] Dir: Dellia Nims. Reuel Derby, MD    Last labs reviewed from 2019    Assessment & Plan:   1. Pulmonary emphysema, unspecified emphysema type ([place]) Patient with heavy tobacco use, and denies any increased symptoms of concern in the recent past. She does not feel she needs any inhalers presently when discussed  - COMPLETE METABOLIC PANEL WITH GFR - CBC with Differential/Platelet  2. Aortic atherosclerosis ([place]) Noted this on her CT scan Statins were entertained in the past, and await her lipid panel which was rechecked today.  3. Tobacco use Did strongly encouraged trying to lessen the tobacco use, although she is not interested in trying to quit smoking presently Do feel continuing the CT screening scans would be good, and she has an upcoming Medicare wellness visit where that will be discussed, and can get further information about potential cost, as it is unclear  about the cost of this procedure.  4. Obesity (BMI 30.0-34.9) Weight has been relatively stable in the recent past.  Noted the importance of a healthy weight maintenance over time.  5. Hyperlipidemia, unspecified hyperlipidemia type Reviewed the prior lipid panel, with a slightly greater than desired LDL She agreed to have that checked today.  Has only had a cup of coffee earlier this morning.  - COMPLETE METABOLIC PANEL WITH GFR - Lipid panel  6. Elevated BP without diagnosis of hypertension Her first blood pressure had a slightly elevated systolic reading, and was better on my recheck. She has no history of hypertension in her past. We will continue to monitor.   Do feel it would be helpful to have a annual exam including a woman's health assessment and she agreed, and asked that she have that scheduled before the end of the year with a female colleague here.  Can follow-up sooner as needed.    Towanda Malkin, MD 11/18/19 2:37 PM

## 2019-11-18 NOTE — Telephone Encounter (Signed)
Spoke with pt and scheduled her an appointment with Dr Roxan Hockey for this afternoon and her medicare wellness with [facility] for next month. Pt was very irritated because someone told her that we would return her call on yesterday and she sat home waiting. I apologized to her and told her that we called back as soon as we could. I did not tell her that the reason for [place] not returning her call was because the phone call was actually routed to the wrong office.

## 2019-11-18 NOTE — Progress Notes (Deleted)
Patient is a 69 year old female Last visit at [facility] was in May 2020 with a telemedicine visit Follows up today   Emphysema/tobacco dependence Smokes 2 ppd.  Denies shortness of breath or wheezing. Endorses daily morning productive cough.  Declined any inhalers offered on her last visit CT for lung cancer screening was done in June 2020 and unremarkable, follow-up ordered for June 2021 although not obtained. Aortic atherosclerosis -noted on CT Declined a statin offer in the past  Hyperlipidemia Medication regimen-none Lab Results  Component Value Date   CHOL 192 04/25/2017   HDL 62 04/25/2017   LDLCALC 108 (H) 04/25/2017   TRIG 117 04/25/2017   CHOLHDL 3.1 04/25/2017    H/o borderline diabetes Lab Results  Component Value Date   HGBA1C 5.6 04/25/2017   Lab Results  Component Value Date   LDLCALC 108 (H) 04/25/2017   CREATININE 0.82 04/25/2017   Obesity Wt Readings from Last 3 Encounters:  09/12/18 170 lb (77.1 kg)  08/01/18 171 lb (77.6 kg)  06/20/18 171 lb 9.6 oz (77.8 kg)    Needs a follow-up annual physical/woman's health physical for review of screenings and immunization issues

## 2019-11-19 ENCOUNTER — Ambulatory Visit: Payer: Medicare Other | Admitting: Internal Medicine

## 2019-11-19 LAB — LIPID PANEL
Cholesterol: 206 mg/dL — ABNORMAL HIGH (ref <200)
HDL: 53 mg/dL (ref >=50)
LDL Cholesterol (Calc): 131 mg/dL (calc) — ABNORMAL HIGH
Non-HDL Cholesterol (Calc): 153 mg/dL (calc) — ABNORMAL HIGH (ref <130)
Total CHOL/HDL Ratio: 3.9 (calc) (ref <5.0)
Triglycerides: 114 mg/dL (ref <150)

## 2019-11-19 LAB — COMPLETE METABOLIC PANEL WITH GFR
AG Ratio: 1.7 (calc) (ref 1.0–2.5)
ALT: 10 U/L (ref 6–29)
AST: 15 U/L (ref 10–35)
Albumin: 4 g/dL (ref 3.6–5.1)
Alkaline phosphatase (APISO): 91 U/L (ref 37–153)
BUN: 11 mg/dL (ref 7–25)
CO2: 27 mmol/L (ref 20–32)
Calcium: 9.6 mg/dL (ref 8.6–10.4)
Chloride: 103 mmol/L (ref 98–110)
Creat: 0.83 mg/dL (ref 0.60–0.93)
GFR, Est African American: 82 mL/min/{1.73_m2} (ref >=60)
GFR, Est Non African American: 71 mL/min/{1.73_m2} (ref >=60)
Globulin: 2.4 g/dL (calc) (ref 1.9–3.7)
Glucose, Bld: 101 mg/dL — ABNORMAL HIGH (ref 65–99)
Potassium: 4.5 mmol/L (ref 3.5–5.3)
Sodium: 141 mmol/L (ref 135–146)
Total Bilirubin: 0.5 mg/dL (ref 0.2–1.2)
Total Protein: 6.4 g/dL (ref 6.1–8.1)

## 2019-11-19 LAB — CBC WITH DIFFERENTIAL/PLATELET
Absolute Monocytes: 560 cells/uL (ref 200–950)
Basophils Absolute: 48 cells/uL (ref 0–200)
Basophils Relative: 0.6 %
Eosinophils Absolute: 216 cells/uL (ref 15–500)
Eosinophils Relative: 2.7 %
HCT: 41.5 % (ref 35.0–45.0)
Hemoglobin: 14.4 g/dL (ref 11.7–15.5)
Lymphs Abs: 2520 cells/uL (ref 850–3900)
MCH: 34.5 pg — ABNORMAL HIGH (ref 27.0–33.0)
MCHC: 34.7 g/dL (ref 32.0–36.0)
MCV: 99.5 fL (ref 80.0–100.0)
MPV: 10.1 fL (ref 7.5–12.5)
Monocytes Relative: 7 %
Neutro Abs: 4656 cells/uL (ref 1500–7800)
Neutrophils Relative %: 58.2 %
Platelets: 235 10*3/uL (ref 140–400)
RBC: 4.17 10*6/uL (ref 3.80–5.10)
RDW: 12.7 % (ref 11.0–15.0)
Total Lymphocyte: 31.5 %
WBC: 8 10*3/uL (ref 3.8–10.8)

## 2019-11-20 ENCOUNTER — Encounter: Payer: Self-pay | Admitting: *Deleted

## 2019-11-30 ENCOUNTER — Telehealth: Payer: Self-pay | Admitting: *Deleted

## 2019-11-30 NOTE — Telephone Encounter (Signed)
Patient previously cancelled due to pre service center call with expectation of payment for annual screening. Patient is rescheduled for lung screening scan.

## 2019-12-03 ENCOUNTER — Other Ambulatory Visit: Payer: Self-pay

## 2019-12-03 ENCOUNTER — Ambulatory Visit
Admission: RE | Admit: 2019-12-03 | Discharge: 2019-12-03 | Disposition: A | Discharge status: Home / Self Care | Payer: Medicare Other | Source: Ambulatory Visit | Attending: Oncology | Admitting: Oncology

## 2019-12-03 DIAGNOSIS — Z87891 Personal history of nicotine dependence: Secondary | ICD-10-CM | POA: Diagnosis present

## 2019-12-03 DIAGNOSIS — Z122 Encounter for screening for malignant neoplasm of respiratory organs: Secondary | ICD-10-CM | POA: Diagnosis present

## 2019-12-10 ENCOUNTER — Encounter: Payer: Self-pay | Admitting: *Deleted

## 2019-12-22 ENCOUNTER — Ambulatory Visit (INDEPENDENT_AMBULATORY_CARE_PROVIDER_SITE_OTHER): Payer: Medicare Other

## 2019-12-22 DIAGNOSIS — Z0000 Encounter for general adult medical examination without abnormal findings: Secondary | ICD-10-CM

## 2019-12-22 DIAGNOSIS — Z780 Asymptomatic menopausal state: Secondary | ICD-10-CM | POA: Diagnosis not present

## 2019-12-22 DIAGNOSIS — Z1231 Encounter for screening mammogram for malignant neoplasm of breast: Secondary | ICD-10-CM | POA: Diagnosis not present

## 2019-12-22 NOTE — Patient Instructions (Signed)
Megan Crawford , Thank you for taking time to come for your Medicare Wellness Visit. I appreciate your ongoing commitment to your health goals. Please review the following plan we discussed and let me know if I can assist you in the future.   Screening recommendations/referrals: Colonoscopy: done 06/25/17. Repeat in 2029 Mammogram: done 05/21/17. Please call [phone] to schedule your mammogram and bone density screening.   Recommended yearly ophthalmology/optometry visit for glaucoma screening and checkup Recommended yearly dental visit for hygiene and checkup  Vaccinations: Influenza vaccine: done 11/18/19 Pneumococcal vaccine: done 10/28/18 Tdap vaccine: due Shingles vaccine: Shingrix discussed. Please contact your pharmacy for coverage information.  Covid-19: done 03/09/19, 03/30/19 & 12/03/19  Advanced directives: Advance directive discussed with you today. Even though you declined this today please call our office should you change your mind and we can give you the proper paperwork for you to fill out.  Conditions/risks identified: If you wish to quit smoking, help is available. For free tobacco cessation program offerings call the [facility] at [phone] or Live Well Line at 984-701-8896. You may also visit [email or web address] or email [email or web address] for more information on other programs.   Next appointment: Follow up in one year for your annual wellness visit    Preventive Care 65 Years and Older, Female Preventive care refers to lifestyle choices and visits with your health care provider that can promote health and wellness. What does preventive care include?  A yearly physical exam. This is also called an annual well check.  Dental exams once or twice a year.  Routine eye exams. Ask your health care provider how often you should have your eyes checked.  Personal lifestyle choices, including:  Daily care of your teeth and gums.  Regular physical  activity.  Eating a healthy diet.  Avoiding tobacco and drug use.  Limiting alcohol use.  Practicing safe sex.  Taking low-dose aspirin every day.  Taking vitamin and mineral supplements as recommended by your health care provider. What happens during an annual well check? The services and screenings done by your health care provider during your annual well check will depend on your age, overall health, lifestyle risk factors, and family history of disease. Counseling  Your health care provider may ask you questions about your:  Alcohol use.  Tobacco use.  Drug use.  Emotional well-being.  Home and relationship well-being.  Sexual activity.  Eating habits.  History of falls.  Memory and ability to understand (cognition).  Work and work Statistician.  Reproductive health. Screening  You may have the following tests or measurements:  Height, weight, and BMI.  Blood pressure.  Lipid and cholesterol levels. These may be checked every 5 years, or more frequently if you are over 6 years old.  Skin check.  Lung cancer screening. You may have this screening every year starting at age 87 if you have a 30-pack-year history of smoking and currently smoke or have quit within the past 15 years.  Fecal occult blood test (FOBT) of the stool. You may have this test every year starting at age 15.  Flexible sigmoidoscopy or colonoscopy. You may have a sigmoidoscopy every 5 years or a colonoscopy every 10 years starting at age 8.  Hepatitis C blood test.  Hepatitis B blood test.  Sexually transmitted disease (STD) testing.  Diabetes screening. This is done by checking your blood sugar (glucose) after you have not eaten for a while (fasting). You may have this done every 1-3 years.  Bone density scan. This is done to screen for osteoporosis. You may have this done starting at age 83.  Mammogram. This may be done every 1-2 years. Talk to your health care provider about  how often you should have regular mammograms. Talk with your health care provider about your test results, treatment options, and if necessary, the need for more tests. Vaccines  Your health care provider may recommend certain vaccines, such as:  Influenza vaccine. This is recommended every year.  Tetanus, diphtheria, and acellular pertussis (Tdap, Td) vaccine. You may need a Td booster every 10 years.  Zoster vaccine. You may need this after age 63.  Pneumococcal 13-valent conjugate (PCV13) vaccine. One dose is recommended after age 65.  Pneumococcal polysaccharide (PPSV23) vaccine. One dose is recommended after age 83. Talk to your health care provider about which screenings and vaccines you need and how often you need them. This information is not intended to replace advice given to you by your health care provider. Make sure you discuss any questions you have with your health care provider. Document Released: 02/25/2015 Document Revised: 10/19/2015 Document Reviewed: 11/30/2014 Elsevier Interactive Patient Education  2017 [place] Prevention in the Home Falls can cause injuries. They can happen to people of all ages. There are many things you can do to make your home safe and to help prevent falls. What can I do on the outside of my home?  Regularly fix the edges of walkways and driveways and fix any cracks.  Remove anything that might make you trip as you walk through a door, such as a raised step or threshold.  Trim any bushes or trees on the path to your home.  Use bright outdoor lighting.  Clear any walking paths of anything that might make someone trip, such as rocks or tools.  Regularly check to see if handrails are loose or broken. Make sure that both sides of any steps have handrails.  Any raised decks and porches should have guardrails on the edges.  Have any leaves, snow, or ice cleared regularly.  Use sand or salt on walking paths during  winter.  Clean up any spills in your garage right away. This includes oil or grease spills. What can I do in the bathroom?  Use night lights.  Install grab bars by the toilet and in the tub and shower. Do not use towel bars as grab bars.  Use non-skid mats or decals in the tub or shower.  If you need to sit down in the shower, use a plastic, non-slip stool.  Keep the floor dry. Clean up any water that spills on the floor as soon as it happens.  Remove soap buildup in the tub or shower regularly.  Attach bath mats securely with double-sided non-slip rug tape.  Do not have throw rugs and other things on the floor that can make you trip. What can I do in the bedroom?  Use night lights.  Make sure that you have a light by your bed that is easy to reach.  Do not use any sheets or blankets that are too big for your bed. They should not hang down onto the floor.  Have a firm chair that has side arms. You can use this for support while you get dressed.  Do not have throw rugs and other things on the floor that can make you trip. What can I do in the kitchen?  Clean up any spills right away.  Avoid walking  on wet floors.  Keep items that you use a lot in easy-to-reach places.  If you need to reach something above you, use a strong step stool that has a grab bar.  Keep electrical cords out of the way.  Do not use floor polish or wax that makes floors slippery. If you must use wax, use non-skid floor wax.  Do not have throw rugs and other things on the floor that can make you trip. What can I do with my stairs?  Do not leave any items on the stairs.  Make sure that there are handrails on both sides of the stairs and use them. Fix handrails that are broken or loose. Make sure that handrails are as long as the stairways.  Check any carpeting to make sure that it is firmly attached to the stairs. Fix any carpet that is loose or worn.  Avoid having throw rugs at the top or  bottom of the stairs. If you do have throw rugs, attach them to the floor with carpet tape.  Make sure that you have a light switch at the top of the stairs and the bottom of the stairs. If you do not have them, ask someone to add them for you. What else can I do to help prevent falls?  Wear shoes that:  Do not have high heels.  Have rubber bottoms.  Are comfortable and fit you well.  Are closed at the toe. Do not wear sandals.  If you use a stepladder:  Make sure that it is fully opened. Do not climb a closed stepladder.  Make sure that both sides of the stepladder are locked into place.  Ask someone to hold it for you, if possible.  Clearly mark and make sure that you can see:  Any grab bars or handrails.  First and last steps.  Where the edge of each step is.  Use tools that help you move around (mobility aids) if they are needed. These include:  Canes.  Walkers.  Scooters.  Crutches.  Turn on the lights when you go into a dark area. Replace any light bulbs as soon as they burn out.  Set up your furniture so you have a clear path. Avoid moving your furniture around.  If any of your floors are uneven, fix them.  If there are any pets around you, be aware of where they are.  Review your medicines with your doctor. Some medicines can make you feel dizzy. This can increase your chance of falling. Ask your doctor what other things that you can do to help prevent falls. This information is not intended to replace advice given to you by your health care provider. Make sure you discuss any questions you have with your health care provider. Document Released: 11/25/2008 Document Revised: 07/07/2015 Document Reviewed: 03/05/2014 Elsevier Interactive Patient Education  2017 [employer].

## 2019-12-22 NOTE — Progress Notes (Signed)
Subjective:   Megan Crawford is a 68 y.o. female who presents for Medicare Annual (Subsequent) preventive examination.  Review of Systems     Cardiac Risk Factors include: advanced age (>27men, >69 women);smoking/ tobacco exposure;obesity (BMI >30kg/m2)     Objective:    There were no vitals filed for this visit. There is no height or weight on file to calculate BMI.  Advanced Directives 12/22/2019 09/12/2018 06/25/2017 06/04/2017  Does Patient Have a Medical Advance Directive? No No No No  Would patient like information on creating a medical advance directive? No - Patient declined No - Patient declined - Yes (MAU/Ambulatory/Procedural Areas - Information given)    Current Medications (verified) No outpatient encounter medications on file as of 12/22/2019.   No facility-administered encounter medications on file as of 12/22/2019.    Allergies (verified) Patient has no known allergies.   History: Past Medical History:  Diagnosis Date  . Cardiac murmur   . Fatigue   . Tobacco use    Past Surgical History:  Procedure Laterality Date  . COLONOSCOPY WITH PROPOFOL N/A 06/25/2017   Procedure: COLONOSCOPY WITH PROPOFOL;  Surgeon: [name], MD;  Location: [facility] ENDOSCOPY;  Service: Endoscopy;  Laterality: N/A;  . HERNIA REPAIR  1970's   Family History  Problem Relation Age of Onset  . Alzheimer's disease Mother   . Heart attack Father   . Cancer Father        ? prostate cancer  . Healthy Sister   . Diabetes Brother   . Cancer Brother        colon and lung  . Healthy Son   . Healthy Sister   . Healthy Sister    Social History   Socioeconomic History  . Marital status: Divorced    Spouse name: Not on file  . Number of children: 2  . Years of education: Not on file  . Highest education level: 12th grade  Occupational History  . Occupation: Retired  Tobacco Use  . Smoking status: Current Every Day Smoker    Packs/day: 2.00    Years: 52.00    Pack years: 104.00      Types: Cigarettes  . Smokeless tobacco: Never Used  . Tobacco comment: pt declines information about quitting  Vaping Use  . Vaping Use: Never used  Substance and Sexual Activity  . Alcohol use: No  . Drug use: No  . Sexual activity: Not Currently    Partners: Male  Other Topics Concern  . Not on file  Social History Narrative  . Not on file   Social Determinants of Health   Financial Resource Strain: Low Risk   . Difficulty of Paying Living Expenses: Not hard at all  Food Insecurity: No Food Insecurity  . Worried About Charity fundraiser in the Last Year: Never true  . Ran Out of Food in the Last Year: Never true  Transportation Needs: No Transportation Needs  . Lack of Transportation (Medical): No  . Lack of Transportation (Non-Medical): No  Physical Activity: Insufficiently Active  . Days of Exercise per Week: 3 days  . Minutes of Exercise per Session: 30 min  Stress: No Stress Concern Present  . Feeling of Stress : Not at all  Social Connections: Moderately Isolated  . Frequency of Communication with Friends and Family: More than three times a week  . Frequency of Social Gatherings with Friends and Family: Once a week  . Attends Religious Services: More than 4 times per year  .  Active Member of Clubs or Organizations: No  . Attends Archivist Meetings: Never  . Marital Status: Divorced    Tobacco Counseling Ready to quit: Not Answered Counseling given: Not Answered Comment: pt declines information about quitting   Clinical Intake:  Pre-visit preparation completed: Yes  Pain : No/denies pain     Nutritional Risks: None Diabetes: No  How often do you need to have someone help you when you read instructions, pamphlets, or other written materials from your doctor or pharmacy?: 1 - Never    Interpreter Needed?: No  Information entered by :: Clemetine Marker LPN   Activities of Daily Living In your present state of health, do you have any  difficulty performing the following activities: 12/22/2019 11/18/2019  Hearing? N N  Comment declines hearing aids -  Vision? N N  Difficulty concentrating or making decisions? N N  Walking or climbing stairs? N N  Dressing or bathing? N N  Doing errands, shopping? N N  Preparing Food and eating ? N -  Using the Toilet? N -  In the past six months, have you accidently leaked urine? Y -  Comment wears pads for protection -  Do you have problems with loss of bowel control? N -  Managing your Medications? N -  Managing your Finances? N -  Housekeeping or managing your Housekeeping? N -  Some recent data might be hidden    Patient Care Team: Towanda Malkin, MD as PCP - General (Internal Medicine)  Indicate any recent Medical Services you may have received from other than [facility] providers in the past year (date may be approximate).     Assessment:   This is a routine wellness examination for Megan Crawford.  Hearing/Vision screen  Hearing Screening   125Hz  250Hz  500Hz  1000Hz  2000Hz  3000Hz  4000Hz  6000Hz  8000Hz   Right ear:           Left ear:           Comments: Pt denies hearing difficulty  Vision Screening Comments: [facility] screenings [facility]  Dietary issues and exercise activities discussed: Current Exercise Habits: Home exercise routine, Type of exercise: walking, Time (Minutes): 30, Frequency (Times/Week): 3, Weekly Exercise (Minutes/Week): 90, Intensity: Mild, Exercise limited by: None identified  Goals    . DIET - INCREASE WATER INTAKE     Recommend to drink at least 6-8 8oz glasses of water per day.    . Quit Smoking     If you wish to quit smoking, help is available. For free tobacco cessation program offerings call the [facility] at [phone] or Live Well Line at [phone]. You may also visit [email or web address] or email [email or web address] for more information on other programs.        Depression Screen PHQ  2/9 Scores 12/22/2019 11/18/2019 09/12/2018 06/20/2018 06/17/2017 06/04/2017 04/25/2017  PHQ - 2 Score 0 0 0 1 0 0 0  PHQ- 9 Score - - 1 1 - 0 -    Fall Risk Fall Risk  12/22/2019 11/18/2019 09/12/2018 06/20/2018 06/17/2017  Falls in the past year? 0 0 0 1 No  Number falls in past yr: 0 0 0 0 -  Injury with Fall? 0 0 0 0 -  Risk for fall due to : No Fall Risks - - - -  Risk for fall due to: Comment - - - - -  Follow up Falls prevention discussed - Falls prevention discussed - -  Any stairs in or around the home? Yes  If so, are there any without handrails? No  Home free of loose throw rugs in walkways, pet beds, electrical cords, etc? Yes  Adequate lighting in your home to reduce risk of falls? Yes   ASSISTIVE DEVICES UTILIZED TO PREVENT FALLS:  Life alert? No  Use of a cane, walker or w/c? No  Grab bars in the bathroom? No  Shower chair or bench in shower? No  Elevated toilet seat or a handicapped toilet? No   TIMED UP AND GO:  Was the test performed? No . Telephonic visit.   Cognitive Function:     6CIT Screen 12/22/2019 09/12/2018 06/04/2017  What Year? 0 points 0 points 0 points  What month? 0 points 0 points 0 points  What time? 0 points 0 points 0 points  Count back from 20 0 points 0 points 0 points  Months in reverse 0 points 0 points 0 points  Repeat phrase 0 points 0 points 0 points  Total Score 0 0 0    Immunizations Immunization History  Administered Date(s) Administered  . Fluad Quad(high Dose 65+) 10/28/2018  . Influenza, High Dose Seasonal PF 12/15/2016, 12/07/2017  . Influenza-Unspecified 11/28/2016  . PFIZER SARS-COV-2 Vaccination 03/09/2019, 03/30/2019, 12/03/2019  . Pneumococcal Conjugate-13 06/04/2017  . Pneumococcal Polysaccharide-23 10/28/2018    TDAP status: Due, Education has been provided regarding the importance of this vaccine. Advised may receive this vaccine at local pharmacy or Health Dept. Aware to provide a copy of the vaccination record if  obtained from local pharmacy or Health Dept. Verbalized acceptance and understanding.   Flu Vaccine status: Up to date   Pneumococcal vaccine status: Up to date   Covid-19 vaccine status: Completed vaccines  Qualifies for Shingles Vaccine? Yes   Zostavax completed No   Shingrix Completed?: No.    Education has been provided regarding the importance of this vaccine. Patient has been advised to call insurance company to determine out of pocket expense if they have not yet received this vaccine. Advised may also receive vaccine at local pharmacy or Health Dept. Verbalized acceptance and understanding.  Screening Tests Health Maintenance  Topic Date Due  . DEXA SCAN  Never done  . Hepatitis C Screening  Never done  . TETANUS/TDAP  Never done  . MAMMOGRAM  05/22/2018  . INFLUENZA VACCINE  09/13/2019  . COLONOSCOPY  06/26/2027  . COVID-19 Vaccine  Completed  . PNA vac Low Risk Adult  Completed    Health Maintenance  Health Maintenance Due  Topic Date Due  . DEXA SCAN  Never done  . Hepatitis C Screening  Never done  . TETANUS/TDAP  Never done  . MAMMOGRAM  05/22/2018  . INFLUENZA VACCINE  09/13/2019    Colorectal cancer screening: Completed 06/25/17. Repeat every 10 years   Mammogram status: Completed 05/21/17. Repeat every year   Bone Density status: Ordered today. Pt provided with contact info and advised to call to schedule appt.  Lung Cancer Screening: (Low Dose CT Chest recommended if Age 77-80 years, 30 pack-year currently smoking OR have quit w/in 15years.) does qualify. COMPLETED 12/03/19  Additional Screening:  Hepatitis C Screening: does qualify; postponed   Vision Screening: Recommended annual ophthalmology exams for early detection of glaucoma and other disorders of the eye. Is the patient up to date with their annual eye exam?  Yes  Who is the provider or what is the name of the office in which the patient attends annual eye  exams? [place]  Screening: Recommended annual dental exams for proper oral hygiene  Community Resource Referral / Chronic Care Management: CRR required this visit?  No   CCM required this visit?  No      Plan:     I have personally reviewed and noted the following in the patient's chart:   . Medical and social history . Use of alcohol, tobacco or illicit drugs  . Current medications and supplements . Functional ability and status . Nutritional status . Physical activity . Advanced directives . List of other physicians . Hospitalizations, surgeries, and ER visits in previous 12 months . Vitals . Screenings to include cognitive, depression, and falls . Referrals and appointments  In addition, I have reviewed and discussed with patient certain preventive protocols, quality metrics, and best practice recommendations. A written personalized care plan for preventive services as well as general preventive health recommendations were provided to patient.     Clemetine Marker, LPN   [id number]   Nurse Notes: none

## 2020-01-19 ENCOUNTER — Ambulatory Visit (INDEPENDENT_AMBULATORY_CARE_PROVIDER_SITE_OTHER): Payer: Medicare Other | Admitting: Family Medicine

## 2020-01-19 ENCOUNTER — Other Ambulatory Visit: Payer: Self-pay

## 2020-01-19 ENCOUNTER — Encounter: Payer: Self-pay | Admitting: Family Medicine

## 2020-01-19 VITALS — BP 124/78 | HR 75 | Temp 98.2°F | Resp 16 | Ht 60.0 in | Wt 169.3 lb

## 2020-01-19 DIAGNOSIS — Z780 Asymptomatic menopausal state: Secondary | ICD-10-CM | POA: Diagnosis not present

## 2020-01-19 DIAGNOSIS — N393 Stress incontinence (female) (male): Secondary | ICD-10-CM

## 2020-01-19 DIAGNOSIS — E785 Hyperlipidemia, unspecified: Secondary | ICD-10-CM

## 2020-01-19 DIAGNOSIS — J439 Emphysema, unspecified: Secondary | ICD-10-CM

## 2020-01-19 DIAGNOSIS — Z01419 Encounter for gynecological examination (general) (routine) without abnormal findings: Secondary | ICD-10-CM

## 2020-01-19 DIAGNOSIS — I700 Atherosclerosis of aorta: Secondary | ICD-10-CM

## 2020-01-19 DIAGNOSIS — Z1231 Encounter for screening mammogram for malignant neoplasm of breast: Secondary | ICD-10-CM

## 2020-01-19 DIAGNOSIS — R030 Elevated blood-pressure reading, without diagnosis of hypertension: Secondary | ICD-10-CM

## 2020-01-19 DIAGNOSIS — Z720 Tobacco use: Secondary | ICD-10-CM

## 2020-01-19 DIAGNOSIS — E669 Obesity, unspecified: Secondary | ICD-10-CM

## 2020-01-19 NOTE — Patient Instructions (Addendum)
Preventive Care 71 Years and Older, Female Preventive care refers to lifestyle choices and visits with your health care provider that can promote health and wellness. This includes:  A yearly physical exam. This is also called an annual well check.  Regular dental and eye exams.  Immunizations.  Screening for certain conditions.  Healthy lifestyle choices, such as diet and exercise. What can I expect for my preventive care visit? Physical exam Your health care provider will check:  Height and weight. These may be used to calculate body mass index (BMI), which is a measurement that tells if you are at a healthy weight.  Heart rate and blood pressure.  Your skin for abnormal spots. Counseling Your health care provider may ask you questions about:  Alcohol, tobacco, and drug use.  Emotional well-being.  Home and relationship well-being.  Sexual activity.  Eating habits.  History of falls.  Memory and ability to understand (cognition).  Work and work Statistician.  Pregnancy and menstrual history. What immunizations do I need?  Influenza (flu) vaccine  This is recommended every year. Tetanus, diphtheria, and pertussis (Tdap) vaccine  You may need a Td booster every 10 years. Varicella (chickenpox) vaccine  You may need this vaccine if you have not already been vaccinated. Zoster (shingles) vaccine  You may need this after age 71. Pneumococcal conjugate (PCV13) vaccine  One dose is recommended after age 71. Pneumococcal polysaccharide (PPSV23) vaccine  One dose is recommended after age 71. Measles, mumps, and rubella (MMR) vaccine  You may need at least one dose of MMR if you were born in 1957 or later. You may also need a second dose. Meningococcal conjugate (MenACWY) vaccine  You may need this if you have certain conditions. Hepatitis A vaccine  You may need this if you have certain conditions or if you travel or work in places where you may be exposed  to hepatitis A. Hepatitis B vaccine  You may need this if you have certain conditions or if you travel or work in places where you may be exposed to hepatitis B. Haemophilus influenzae type b (Hib) vaccine  You may need this if you have certain conditions. You may receive vaccines as individual doses or as more than one vaccine together in one shot (combination vaccines). Talk with your health care provider about the risks and benefits of combination vaccines. What tests do I need? Blood tests  Lipid and cholesterol levels. These may be checked every 5 years, or more frequently depending on your overall health.  Hepatitis C test.  Hepatitis B test. Screening  Lung cancer screening. You may have this screening every year starting at age 71 if you have a 30-pack-year history of smoking and currently smoke or have quit within the past 15 years.  Colorectal cancer screening. All adults should have this screening starting at age 71 and continuing until age 15. Your health care provider may recommend screening at age 23 if you are at increased risk. You will have tests every 1-10 years, depending on your results and the type of screening test.  Diabetes screening. This is done by checking your blood sugar (glucose) after you have not eaten for a while (fasting). You may have this done every 1-3 years.  Mammogram. This may be done every 1-2 years. Talk with your health care provider about how often you should have regular mammograms.  BRCA-related cancer screening. This may be done if you have a family history of breast, ovarian, tubal, or peritoneal cancers.  Other tests  Sexually transmitted disease (STD) testing.  Bone density scan. This is done to screen for osteoporosis. You may have this done starting at age 71. Follow these instructions at home: Eating and drinking  Eat a diet that includes fresh fruits and vegetables, whole grains, lean protein, and low-fat dairy products. Limit  your intake of foods with high amounts of sugar, saturated fats, and salt.  Take vitamin and mineral supplements as recommended by your health care provider.  Do not drink alcohol if your health care provider tells you not to drink.  If you drink alcohol: ? Limit how much you have to 0-1 drink a day. ? Be aware of how much alcohol is in your drink. In the [place] one drink equals one 12 oz bottle of beer (355 mL), one 5 oz glass of wine (148 mL), or one 1 oz glass of hard liquor (44 mL). Lifestyle  Take daily care of your teeth and gums.  Stay active. Exercise for at least 30 minutes on 5 or more days each week.  Do not use any products that contain nicotine or tobacco, such as cigarettes, e-cigarettes, and chewing tobacco. If you need help quitting, ask your health care provider.  If you are sexually active, practice safe sex. Use a condom or other form of protection in order to prevent STIs (sexually transmitted infections).  Talk with your health care provider about taking a low-dose aspirin or statin. What's next?  Go to your health care provider once a year for a well check visit.  Ask your health care provider how often you should have your eyes and teeth checked.  Stay up to date on all vaccines. This information is not intended to replace advice given to you by your health care provider. Make sure you discuss any questions you have with your health care provider. Document Revised: 01/23/2018 Document Reviewed: 01/23/2018 Elsevier Patient Education  [employer].   Pelvic Organ Prolapse Pelvic organ prolapse is the stretching, bulging, or dropping of pelvic organs into an abnormal position. It happens when the muscles and tissues that surround and support pelvic structures become weak or stretched. Pelvic organ prolapse can involve the:  Vagina (vaginal prolapse).  Uterus (uterine prolapse).  Bladder (cystocele).  Rectum (rectocele).  Intestines  (enterocele). When organs other than the vagina are involved, they often bulge into the vagina or protrude from the vagina, depending on how severe the prolapse is. What are the causes? This condition may be caused by:  Pregnancy, labor, and childbirth.  Past pelvic surgery.  Decreased production of the hormone estrogen associated with menopause.  Consistently lifting more than 50 lb (23 kg).  Obesity.  Long-term inability to pass stool (chronic constipation).  A cough that lasts a long time (chronic).  Buildup of fluid in the abdomen due to certain diseases and other conditions. What are the signs or symptoms? Symptoms of this condition include:  Passing a little urine (loss of bladder control) when you cough, sneeze, strain, and exercise (stress incontinence). This may be worse immediately after childbirth. It may gradually improve over time.  Feeling pressure in your pelvis or vagina. This pressure may increase when you cough or when you are passing stool.  A bulge that protrudes from the opening of your vagina.  Difficulty passing urine or stool.  Pain in your lower back.  Pain, discomfort, or disinterest in sex.  Repeated bladder infections (urinary tract infections).  Difficulty inserting a tampon. In some people, this  condition causes no symptoms. How is this diagnosed? This condition may be diagnosed based on a vaginal and rectal exam. During the exam, you may be asked to cough and strain while you are lying down, sitting, and standing up. Your health care provider will determine if other tests are required, such as bladder function tests. How is this treated? Treatment for this condition may depend on your symptoms. Treatment may include:  Lifestyle changes, such as changes to your diet.  Emptying your bladder at scheduled times (bladder training therapy). This can help reduce or avoid urinary incontinence.  Estrogen. Estrogen may help mild prolapse by  increasing the strength and tone of pelvic floor muscles.  Kegel exercises. These may help mild cases of prolapse by strengthening and tightening the muscles of the pelvic floor.  A soft, flexible device that helps support the vaginal walls and keep pelvic organs in place (pessary). This is inserted into your vagina by your health care provider.  Surgery. This is often the only form of treatment for severe prolapse. Follow these instructions at home:  Avoid drinking beverages that contain caffeine or alcohol.  Increase your intake of high-fiber foods. This can help decrease constipation and straining during bowel movements.  Lose weight if recommended by your health care provider.  Wear a sanitary pad or adult diapers if you have urinary incontinence.  Avoid heavy lifting and straining with exercise and work. Do not hold your breath when you perform mild to moderate lifting and exercise activities. Limit your activities as directed by your health care provider.  Do Kegel exercises as directed by your health care provider. To do this: ? Squeeze your pelvic floor muscles tight. You should feel a tight lift in your rectal area and a tightness in your vaginal area. Keep your stomach, buttocks, and legs relaxed. ? Hold the muscles tight for up to 10 seconds. ? Relax your muscles. ? Repeat this exercise 50 times a day, or as many times as told by your health care provider. Continue to do this exercise for at least 4-6 weeks, or for as long as told by your health care provider.  Take over-the-counter and prescription medicines only as told by your health care provider.  If you have a pessary, take care of it as told by your health care provider.  Keep all follow-up visits as told by your health care provider. This is important. Contact a health care provider if you:  Have symptoms that interfere with your daily activities or sex life.  Need medicine to help with the discomfort.  Notice  bleeding from your vagina that is not related to your period.  Have a fever.  Have pain or bleeding when you urinate.  Have bleeding when you pass stool.  Pass urine when you have sex.  Have chronic constipation.  Have a pessary that falls out.  Have bad smelling vaginal discharge.  Have an unusual, low pain in your abdomen. Summary  Pelvic organ prolapse is the stretching, bulging, or dropping of pelvic organs into an abnormal position. It happens when the muscles and tissues that surround and support pelvic structures become weak or stretched.  When organs other than the vagina are involved, they often bulge into the vagina or protrude from the vagina, depending on how severe the prolapse is.  In most cases, this condition needs to be treated only if it produces symptoms. Treatment may include lifestyle changes, estrogen, Kegel exercises, pessary insertion, or surgery.  Avoid heavy lifting and  straining with exercise and work. Do not hold your breath when you perform mild to moderate lifting and exercise activities. Limit your activities as directed by your health care provider. This information is not intended to replace advice given to you by your health care provider. Make sure you discuss any questions you have with your health care provider. Document Revised: 02/20/2017 Document Reviewed: 02/20/2017 Elsevier Patient Education  2020 [employer].

## 2020-01-19 NOTE — Progress Notes (Signed)
Patient: Megan Crawford, Female    DOB: 1950/12/27, 69 y.o.   MRN: [id number] Towanda Malkin, MD Visit Date: 01/19/2020  Today's Provider: Delsa Grana, PA-C   Chief Complaint  Patient presents with  . Annual Exam   Subjective:   Annual physical exam:  Megan Crawford is a 69 y.o. female who presents today for complete physical exam:  Exercise/Activity:  3 d a week about 30 min  Diet/nutrition:  Overall healthy, but eats what she wants Sleep:  Sleeps well  Due for Dexa and mammogram - scheduled in about a month  USPSTF grade A and B recommendations - reviewed and addressed today  Depression:  Phq 9 completed today by patient, was reviewed by me with patient in the room PHQ score is neg, reviewed PHQ 2/9 Scores 01/19/2020 12/22/2019 11/18/2019 09/12/2018  PHQ - 2 Score 0 0 0 0  PHQ- 9 Score - - - 1   Depression screen [facility] 2/9 01/19/2020 12/22/2019 11/18/2019 09/12/2018 06/20/2018  Decreased Interest 0 0 - 0 0  Down, Depressed, Hopeless 0 0 0 0 1  PHQ - 2 Score 0 0 0 0 1  Altered sleeping - - - 0 0  Tired, decreased energy - - - 1 0  Change in appetite - - - 0 0  Feeling bad or failure about yourself  - - - 0 0  Trouble concentrating - - - 0 0  Moving slowly or fidgety/restless - - - 0 0  Suicidal thoughts - - - 0 0  PHQ-9 Score - - - 1 1  Difficult doing work/chores - - - Not difficult at all Not difficult at all    Alcohol screening:   Office Visit from 01/19/2020 in [facility]  AUDIT-C Score 0      Immunizations and Health Maintenance: Health Maintenance  Topic Date Due  . DEXA SCAN  Never done  . Hepatitis C Screening  Never done  . TETANUS/TDAP  Never done  . MAMMOGRAM  05/22/2018  . INFLUENZA VACCINE  09/13/2019  . COLONOSCOPY  06/26/2027  . COVID-19 Vaccine  Completed  . PNA vac Low Risk Adult  Completed     Hep C Screening: due  STD testing and prevention (HIV/chl/gon/syphilis):  see above, no additional testing desired by pt  today  Intimate partner violence: denies, feels safe  Sexual History/Pain during Intercourse: Divorced, not currently sexually active  Menstrual History/LMP/Abnormal Bleeding: menopause late 30's No LMP recorded (lmp unknown). Patient is postmenopausal.  Incontinence Symptoms:  A little leaking with cough or sneezing - stress incontinence wears heavy liners Rarely has urge incontinence  Breast cancer:  Mammogram due - scheduled 02/25/2020  Cervical cancer screening: aged out Pt denies family hx of cancers - breast, ovarian, uterine Brother has hx of colon cancer  Osteoporosis:   Discussion on osteoporosis per age, including high calcium and vitamin D supplementation, weight bearing exercises Pt is not supplementing with daily calcium/Vit D. Due- Bone scan/dexa  Skin cancer:  Hx of skin CA -  NO Discussed atypical lesions   Colorectal cancer:   Colonoscopy is UTD - done in 2019 - reviewed today Discussed concerning signs and sx of CRC, pt denies melena hematochezia   Lung cancer:  Doing screening - done in Oct 2021 - reviewed last scan - showed atherosclerotic disease and evidence of likely COPD Low Dose CT Chest recommended if Age 66-80 years, 30 pack-year currently smoking OR have quit w/in 15years. Patient does  qualify.    Social History   Tobacco Use  . Smoking status: Current Every Day Smoker    Packs/day: 2.00    Years: 52.00    Pack years: 104.00    Types: Cigarettes  . Smokeless tobacco: Never Used  . Tobacco comment: pt declines information about quitting  Vaping Use  . Vaping Use: Never used  Substance Use Topics  . Alcohol use: No  . Drug use: No       [facility] Visit from 01/19/2020 in [facility]  AUDIT-C Score 0      Family History  Problem Relation Age of Onset  . Alzheimer's disease Mother   . Heart attack Father   . Cancer Father        ? prostate cancer  . Healthy Sister   . Diabetes Brother   . Cancer Brother         colon and lung  . Healthy Son   . Healthy Sister   . Healthy Sister      Blood pressure/Hypertension: BP Readings from Last 3 Encounters:  01/19/20 124/78  11/18/19 (!) 144/60  09/12/18 (!) 150/72    Weight/Obesity: Wt Readings from Last 3 Encounters:  01/19/20 169 lb 4.8 oz (76.8 kg)  12/03/19 169 lb (76.7 kg)  11/18/19 169 lb 11.2 oz (77 kg)   BMI Readings from Last 3 Encounters:  01/19/20 33.06 kg/m  12/03/19 33.01 kg/m  11/18/19 33.14 kg/m     Lipids:  Lab Results  Component Value Date   CHOL 206 (H) 11/18/2019   CHOL 192 04/25/2017   Lab Results  Component Value Date   HDL 53 11/18/2019   HDL 62 04/25/2017   Lab Results  Component Value Date   LDLCALC 131 (H) 11/18/2019   LDLCALC 108 (H) 04/25/2017   Lab Results  Component Value Date   TRIG 114 11/18/2019   TRIG 117 04/25/2017   Lab Results  Component Value Date   CHOLHDL 3.9 11/18/2019   CHOLHDL 3.1 04/25/2017   No results found for: LDLDIRECT Based on the results of lipid panel his/her cardiovascular risk factor ( using [place] )  in the next 10 years is: The 10-year ASCVD risk score Mikey Bussing DC Brooke Bonito., et al., 2013) is: 15.7%   Values used to calculate the score:     Age: 61 years     Sex: Female     Is Non-Hispanic African American: No     Diabetic: No     Tobacco smoker: Yes     Systolic Blood Pressure: 932 mmHg     Is BP treated: No     HDL Cholesterol: 53 mg/dL     Total Cholesterol: 206 mg/dL Glucose:  Glucose, Bld  Date Value Ref Range Status  11/18/2019 101 (H) 65 - 99 mg/dL Final    Comment:    .            Fasting reference interval . For someone without known diabetes, a glucose value between 100 and 125 mg/dL is consistent with prediabetes and should be confirmed with a follow-up test. .   04/25/2017 93 65 - 139 mg/dL Final    Comment:    .        Non-fasting reference interval .    Hypertension: BP Readings from Last 3 Encounters:  01/19/20 124/78  11/18/19  (!) 144/60  09/12/18 (!) 150/72   Obesity: Wt Readings from Last 3 Encounters:  01/19/20 169 lb 4.8 oz (76.8  kg)  12/03/19 169 lb (76.7 kg)  11/18/19 169 lb 11.2 oz (77 kg)   BMI Readings from Last 3 Encounters:  01/19/20 33.06 kg/m  12/03/19 33.01 kg/m  11/18/19 33.14 kg/m      Advanced Care Planning:  A voluntary discussion about advance care planning including the explanation and discussion of advance directives.   Discussed health care proxy and Living will, and the patient not able to identify anyone to be medical POA.   Patient does not have a living will at present time.   Social History      She        Social History   Socioeconomic History  . Marital status: Divorced    Spouse name: Not on file  . Number of children: 2  . Years of education: Not on file  . Highest education level: 12th grade  Occupational History  . Occupation: Retired  Tobacco Use  . Smoking status: Current Every Day Smoker    Packs/day: 2.00    Years: 52.00    Pack years: 104.00    Types: Cigarettes  . Smokeless tobacco: Never Used  . Tobacco comment: pt declines information about quitting  Vaping Use  . Vaping Use: Never used  Substance and Sexual Activity  . Alcohol use: No  . Drug use: No  . Sexual activity: Not Currently    Partners: Male  Other Topics Concern  . Not on file  Social History Narrative  . Not on file   Social Determinants of Health   Financial Resource Strain: Low Risk   . Difficulty of Paying Living Expenses: Not hard at all  Food Insecurity: No Food Insecurity  . Worried About Charity fundraiser in the Last Year: Never true  . Ran Out of Food in the Last Year: Never true  Transportation Needs: No Transportation Needs  . Lack of Transportation (Medical): No  . Lack of Transportation (Non-Medical): No  Physical Activity: Insufficiently Active  . Days of Exercise per Week: 3 days  . Minutes of Exercise per Session: 30 min  Stress: No Stress Concern  Present  . Feeling of Stress : Not at all  Social Connections: Moderately Isolated  . Frequency of Communication with Friends and Family: More than three times a week  . Frequency of Social Gatherings with Friends and Family: Once a week  . Attends Religious Services: More than 4 times per year  . Active Member of Clubs or Organizations: No  . Attends Archivist Meetings: Never  . Marital Status: Divorced    Family History        Family History  Problem Relation Age of Onset  . Alzheimer's disease Mother   . Heart attack Father   . Cancer Father        ? prostate cancer  . Healthy Sister   . Diabetes Brother   . Cancer Brother        colon and lung  . Healthy Son   . Healthy Sister   . Healthy Sister     Patient Active Problem List   Diagnosis Date Noted  . Elevated BP without diagnosis of hypertension 11/18/2019  . Hyperlipidemia 11/18/2019  . Pulmonary emphysema ([place]) 11/18/2019  . Aortic atherosclerosis ([place]) 06/20/2018  . Colon cancer screening   . Polyp of sigmoid colon   . Mitral regurgitation and aortic stenosis 06/17/2017  . Aortic valve stenosis, mild 04/25/2017  . Obesity (BMI 30.0-34.9) 04/25/2017  . Bright red blood per  rectum 04/25/2017  . Borderline diabetes 04/25/2017  . Heart murmur 07/14/2013  . Tobacco use     Past Surgical History:  Procedure Laterality Date  . COLONOSCOPY WITH PROPOFOL N/A 06/25/2017   Procedure: COLONOSCOPY WITH PROPOFOL;  Surgeon: [name], MD;  Location: [facility] ENDOSCOPY;  Service: Endoscopy;  Laterality: N/A;  . HERNIA REPAIR  1970's    No current outpatient medications on file.  No Known Allergies  Patient Care Team: Towanda Malkin, MD as PCP - General (Internal Medicine)  Review of Systems  Constitutional: Negative.  Negative for activity change, appetite change, fatigue and unexpected weight change.  HENT: Negative.   Eyes: Negative.   Respiratory: Positive for cough. Negative for choking,  chest tightness, shortness of breath and wheezing.   Cardiovascular: Negative.  Negative for chest pain, palpitations and leg swelling.  Gastrointestinal: Negative.  Negative for abdominal pain and blood in stool.  Endocrine: Negative.   Genitourinary: Negative.   Musculoskeletal: Negative.  Negative for arthralgias, gait problem, joint swelling and myalgias.  Skin: Negative.  Negative for pallor and rash.  Allergic/Immunologic: Negative.   Neurological: Negative.  Negative for syncope and weakness.  Hematological: Negative.   Psychiatric/Behavioral: Negative.  Negative for dysphoric mood, self-injury and suicidal ideas. The patient is not nervous/anxious.   All other systems reviewed and are negative.    I personally reviewed active problem list, medication list, allergies, family history, social history, health maintenance, notes from last encounter, lab results, imaging with the patient/caregiver today.        Objective:   Vitals:  Vitals:   01/19/20 0902  BP: 124/78  Pulse: 75  Resp: 16  Temp: 98.2 F (36.8 C)  TempSrc: Oral  SpO2: 94%  Weight: 169 lb 4.8 oz (76.8 kg)  Height: 5' (1.524 m)    Body mass index is 33.06 kg/m.  Physical Exam Vitals and nursing note reviewed. Exam conducted with a chaperone present.  Constitutional:      General: She is not in acute distress.    Appearance: Normal appearance. She is well-developed. She is obese. She is not ill-appearing, toxic-appearing or diaphoretic.  HENT:     Head: Normocephalic and atraumatic.     Right Ear: External ear normal.     Left Ear: External ear normal.     Nose: Nose normal. No congestion or rhinorrhea.     Mouth/Throat:     Mouth: Mucous membranes are moist.     Pharynx: Oropharynx is clear. Uvula midline. No oropharyngeal exudate or posterior oropharyngeal erythema.  Eyes:     General: Lids are normal. No scleral icterus.       Right eye: No discharge.        Left eye: No discharge.      Conjunctiva/sclera: Conjunctivae normal.     Pupils: Pupils are equal, round, and reactive to light.  Neck:     Trachea: Phonation normal. No tracheal deviation.  Cardiovascular:     Rate and Rhythm: Normal rate and regular rhythm.     Pulses: Normal pulses.          Radial pulses are 2+ on the right side and 2+ on the left side.       Posterior tibial pulses are 2+ on the right side and 2+ on the left side.     Heart sounds: Normal heart sounds. No murmur heard.  No friction rub. No gallop.   Pulmonary:     Effort: Pulmonary effort is normal. No respiratory distress.  Breath sounds: Normal breath sounds. No stridor. No wheezing, rhonchi or rales.  Chest:     Chest wall: No tenderness.     Breasts:        Right: No swelling, bleeding, inverted nipple, mass, nipple discharge, skin change or tenderness.        Left: Normal. No swelling, bleeding, inverted nipple, mass, nipple discharge, skin change or tenderness.  Abdominal:     General: Bowel sounds are normal. There is no distension.     Palpations: Abdomen is soft.     Tenderness: There is no abdominal tenderness. There is no right CVA tenderness, left CVA tenderness, guarding or rebound.  Genitourinary:    General: Normal vulva.     Urethra: No prolapse or urethral swelling.     Vagina: Normal.     Cervix: Normal.     Uterus: Normal.      Adnexa: Right adnexa normal and left adnexa normal.  Musculoskeletal:        General: No deformity.     Cervical back: Normal range of motion and neck supple.     Right lower leg: No edema.     Left lower leg: No edema.  Lymphadenopathy:     Cervical: No cervical adenopathy.     Upper Body:     Right upper body: No supraclavicular, axillary or pectoral adenopathy.     Left upper body: No supraclavicular, axillary or pectoral adenopathy.  Skin:    General: Skin is warm and dry.     Capillary Refill: Capillary refill takes less than 2 seconds.     Coloration: Skin is not jaundiced or  pale.     Findings: No rash.  Neurological:     Mental Status: She is alert and oriented to person, place, and time. Mental status is at baseline.     Motor: No abnormal muscle tone.     Gait: Gait normal.  Psychiatric:        Mood and Affect: Mood normal.        Speech: Speech normal.        Behavior: Behavior normal.       Fall Risk: Fall Risk  01/19/2020 12/22/2019 11/18/2019 09/12/2018 06/20/2018  Falls in the past year? 0 0 0 0 1  Number falls in past yr: 0 0 0 0 0  Injury with Fall? 0 0 0 0 0  Risk for fall due to : - No Fall Risks - - -  Risk for fall due to: Comment - - - - -  Follow up Falls evaluation completed Falls prevention discussed - Falls prevention discussed -    Functional Status Survey: Is the patient deaf or have difficulty hearing?: No Does the patient have difficulty seeing, even when wearing glasses/contacts?: No Does the patient have difficulty concentrating, remembering, or making decisions?: No Does the patient have difficulty walking or climbing stairs?: No Does the patient have difficulty dressing or bathing?: No Does the patient have difficulty doing errands alone such as visiting a doctor's office or shopping?: No   Assessment & Plan:    CPE completed today  . USPSTF grade A and B recommendations reviewed with patient; age-appropriate recommendations, preventive care, screening tests, etc discussed and encouraged; healthy living encouraged; see AVS for patient education given to patient  . Discussed importance of 150 minutes of physical activity weekly, AHA exercise recommendations given to pt in AVS/handout  . Discussed importance of healthy diet:  eating lean meats and proteins, avoiding trans  fats and saturated fats, avoid simple sugars and excessive carbs in diet, eat 6 servings of fruit/vegetables daily and drink plenty of water and avoid sweet beverages.    . Recommended pt to do annual eye exam and routine dental  exams/cleanings  . Depression, alcohol, fall screening completed as documented above and per flowsheets  . Reviewed Health Maintenance: Health Maintenance  Topic Date Due  . DEXA SCAN  Never done  . Hepatitis C Screening  Never done  . TETANUS/TDAP  Never done  . MAMMOGRAM  05/22/2018  . INFLUENZA VACCINE  09/13/2019  . COLONOSCOPY  06/26/2027  . COVID-19 Vaccine  Completed  . PNA vac Low Risk Adult  Completed    . Immunizations: Immunization History  Administered Date(s) Administered  . Fluad Quad(high Dose 65+) 10/28/2018  . Influenza, High Dose Seasonal PF 12/15/2016, 12/07/2017  . Influenza-Unspecified 11/28/2016  . PFIZER SARS-COV-2 Vaccination 03/09/2019, 03/30/2019, 12/03/2019  . Pneumococcal Conjugate-13 06/04/2017  . Pneumococcal Polysaccharide-23 10/28/2018      ICD-10-CM   1. Well woman exam with routine gynecological exam  Z01.419    Breast exam and pelvic done  2. Stress incontinence  N39.3    Encouraged to do pelvic floor exercises offered referrals  3. Encounter for screening mammogram for malignant neoplasm of breast  Z12.31    She has her mammogram scheduled in January  4. Postmenopausal estrogen deficiency  Z78.0    Bone density scheduled in January encouraged to supplement daily with vitamin D and calcium  5. Pulmonary emphysema, unspecified emphysema type (HCC)  J43.9    Chronic cough but no dyspnea on exertion, heavy smoker  6. Aortic atherosclerosis (HCC)  I70.0    Discussed statin medication and atherosclerotic disease noted on her CT scans, patient does not want to start any medicines  7. Tobacco use  Z72.0    Heavy smoker, refuses smoking cessation information or resources  8. Obesity (BMI 30.0-34.9)  E66.9    Encourage healthy diet, reducing calories and or portions and increasing physical activity  9. Hyperlipidemia, unspecified hyperlipidemia type  E78.5    Cholesterol levels and ASCVD risk score and statin medications reviewed, patient  refuses starting any medicines  10. Elevated BP without diagnosis of hypertension  R03.0    Blood pressure was in optimal range today, not on medications   Due for hep c screening, but not doing any other labs today - will do with next routine labs  Flu shot was done on 11/18/2019 - documentation of administration and charges not complete, but pt states she did get it done - cannot validate      Delsa Grana, PA-C 01/19/20 9:11 AM  [facility]

## 2020-02-25 ENCOUNTER — Ambulatory Visit
Admission: RE | Admit: 2020-02-25 | Discharge: 2020-02-25 | Disposition: A | Discharge status: Home / Self Care | Payer: Medicare Other | Source: Ambulatory Visit | Attending: Internal Medicine | Admitting: Internal Medicine

## 2020-02-25 ENCOUNTER — Other Ambulatory Visit: Payer: Self-pay

## 2020-02-25 DIAGNOSIS — Z780 Asymptomatic menopausal state: Secondary | ICD-10-CM | POA: Insufficient documentation

## 2020-02-25 DIAGNOSIS — Z1231 Encounter for screening mammogram for malignant neoplasm of breast: Secondary | ICD-10-CM

## 2020-03-15 IMAGING — MG MM DIGITAL SCREENING BILAT W/ CAD
5 series · 5 of 5 positions shown · non-contrast
Comparison: Previous exam(s).

ACR Breast Density Category a: The breast tissue is almost entirely
fatty.

CLINICAL DATA: Screening.

EXAM:
DIGITAL SCREENING BILATERAL MAMMOGRAM WITH CAD

[L CC]
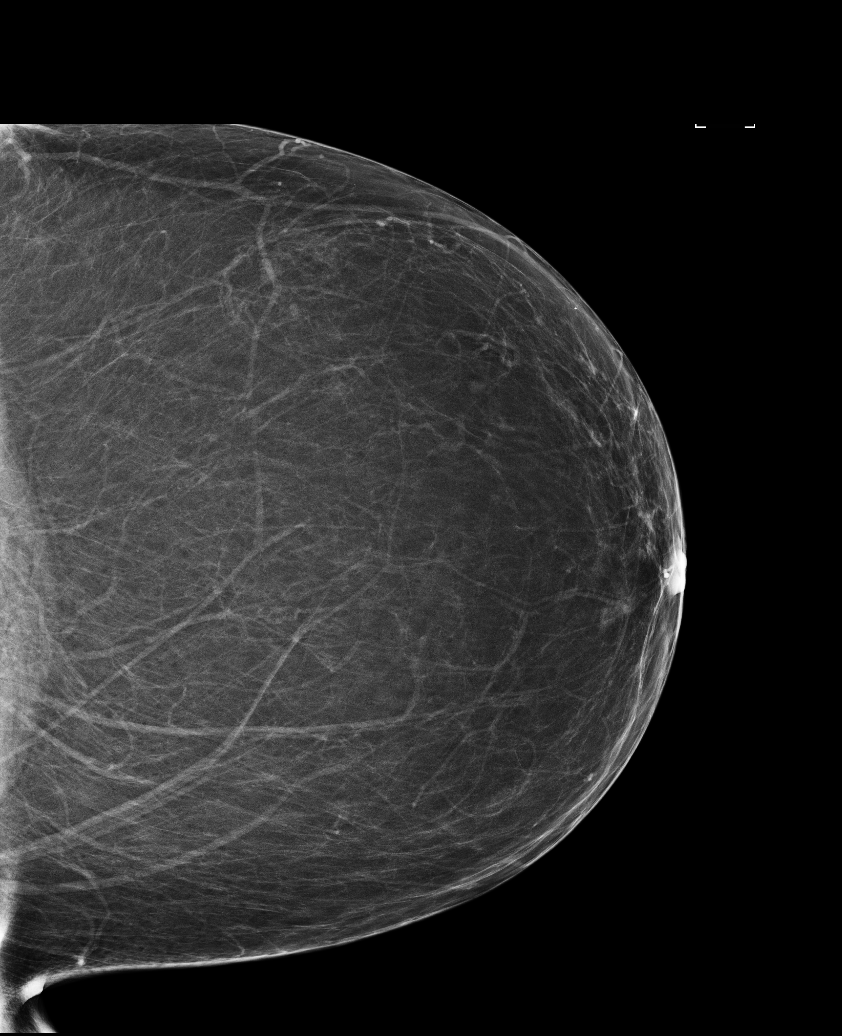

[L MLO (1 of 2)]
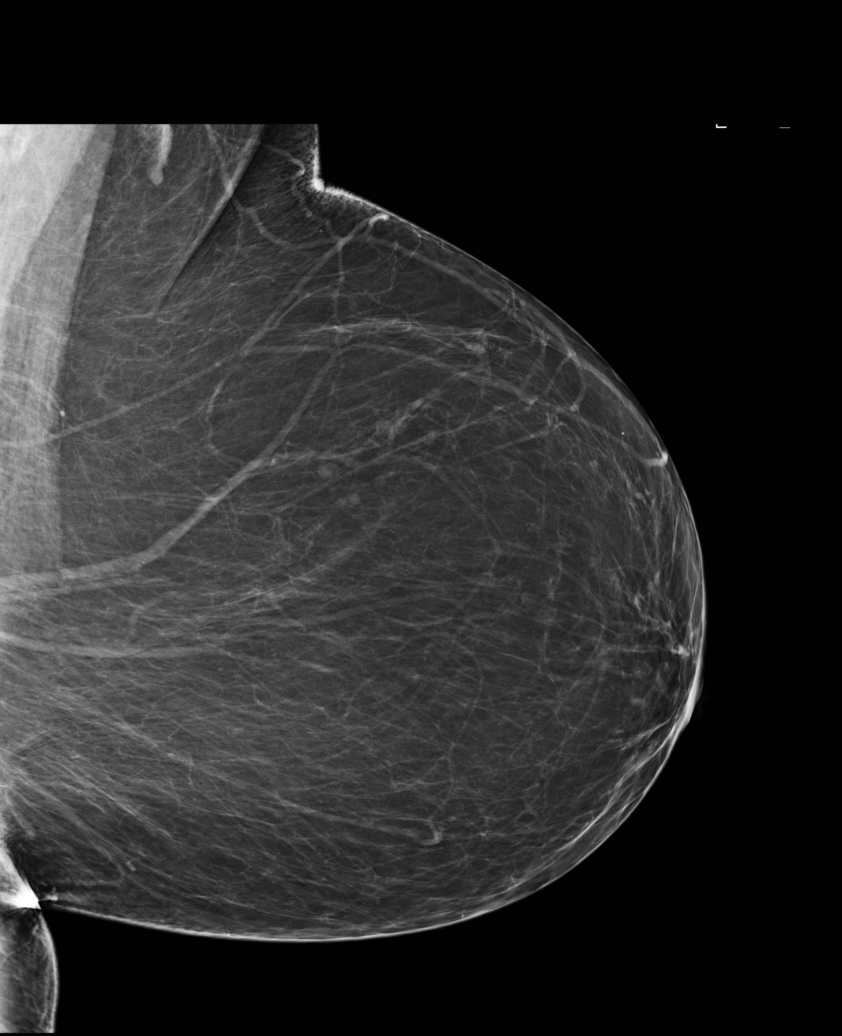

[R CC]
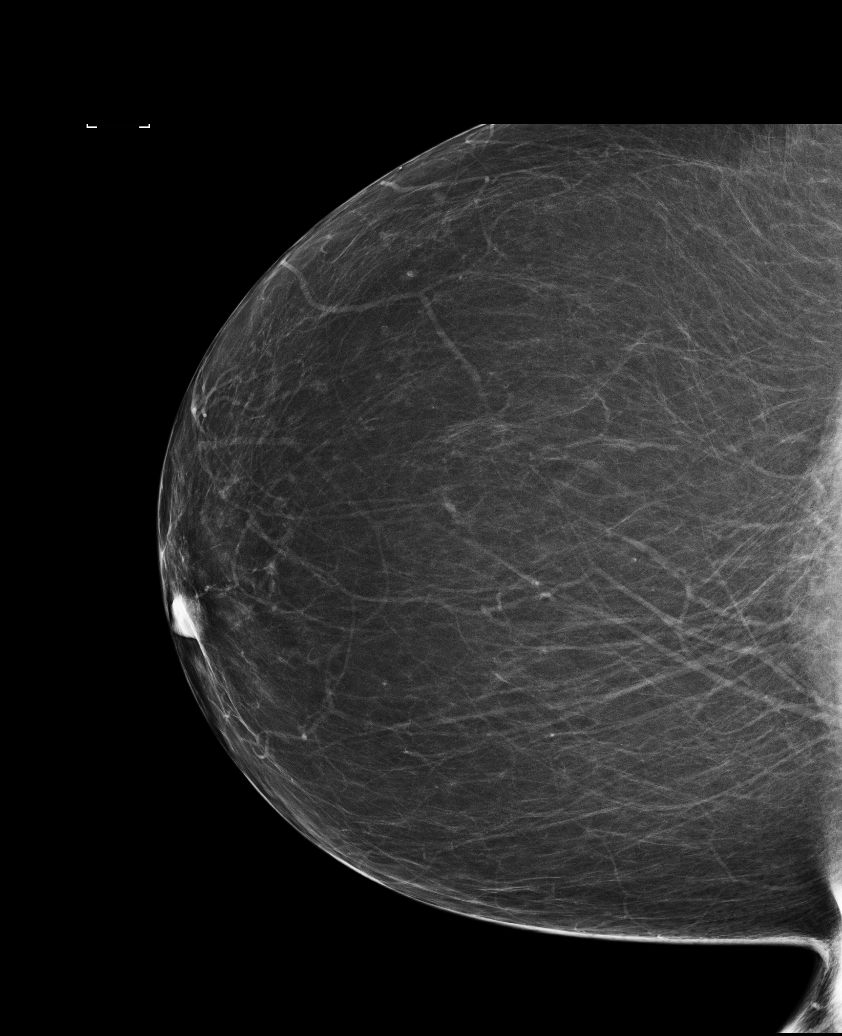

[R MLO]
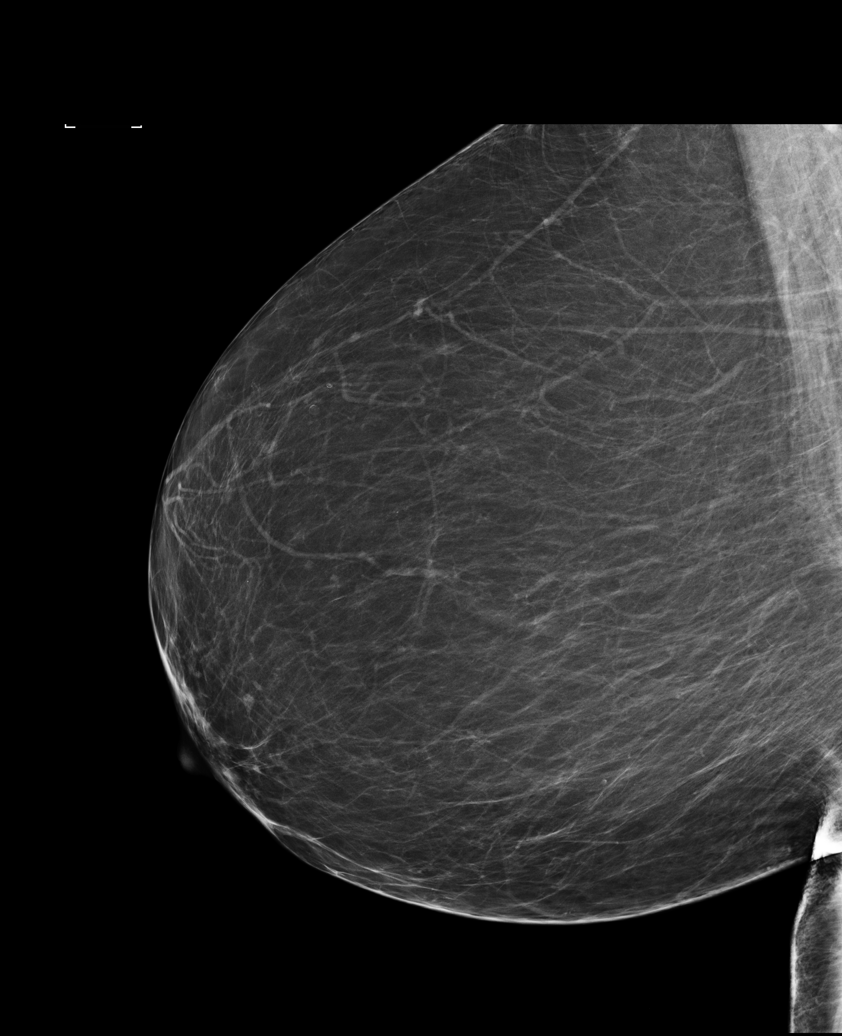

[L MLO (2 of 2)]
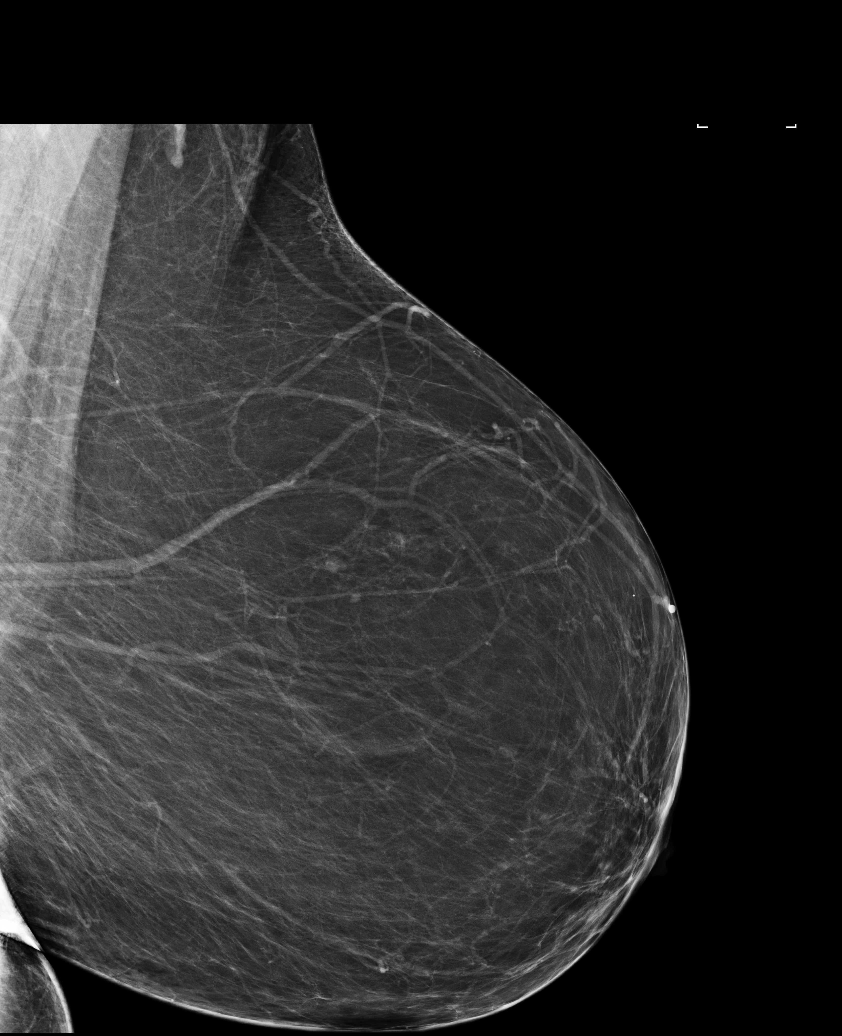

[5 of 5 positions shown; findings below may reference images not displayed]

FINDINGS: There are no findings suspicious for malignancy. Images were
processed with CAD.
IMPRESSION: No mammographic evidence of malignancy. A result letter of this
screening mammogram will be mailed directly to the patient.

RECOMMENDATION:
Screening mammogram in one year. (Code:[id number])

BI-RADS CATEGORY  1: Negative.

## 2020-11-30 ENCOUNTER — Ambulatory Visit (INDEPENDENT_AMBULATORY_CARE_PROVIDER_SITE_OTHER): Payer: Medicare Other

## 2020-11-30 ENCOUNTER — Other Ambulatory Visit: Payer: Self-pay

## 2020-11-30 DIAGNOSIS — Z23 Encounter for immunization: Secondary | ICD-10-CM | POA: Diagnosis not present

## 2020-12-22 ENCOUNTER — Ambulatory Visit (INDEPENDENT_AMBULATORY_CARE_PROVIDER_SITE_OTHER): Payer: Medicare Other

## 2020-12-22 DIAGNOSIS — Z122 Encounter for screening for malignant neoplasm of respiratory organs: Secondary | ICD-10-CM

## 2020-12-22 DIAGNOSIS — Z1231 Encounter for screening mammogram for malignant neoplasm of breast: Secondary | ICD-10-CM

## 2020-12-22 DIAGNOSIS — Z0000 Encounter for general adult medical examination without abnormal findings: Secondary | ICD-10-CM | POA: Diagnosis not present

## 2020-12-22 NOTE — Patient Instructions (Signed)
Megan Crawford , Thank you for taking time to come for your Medicare Wellness Visit. I appreciate your ongoing commitment to your health goals. Please review the following plan we discussed and let me know if I can assist you in the future.   Screening recommendations/referrals: Colonoscopy: done 06/25/17. Repeat 06/2022 Mammogram: done 02/25/20. Please call [phone] to schedule your mammogram.  Bone Density: done 02/25/20 Recommended yearly ophthalmology/optometry visit for glaucoma screening and checkup Recommended yearly dental visit for hygiene and checkup  Vaccinations: Influenza vaccine: done 11/30/20 Pneumococcal vaccine: done 10/28/18 Tdap vaccine: due Shingles vaccine: Shingrix discussed. Please contact your pharmacy for coverage information.  Covid-19: done 03/09/19, 03/30/19 & 12/03/19  Advanced directives: Advance directive discussed with you today. Even though you declined this today please call our office should you change your mind and we can give you the proper paperwork for you to fill out.   Conditions/risks identified: If you wish to quit smoking, help is available. For free tobacco cessation program offerings call the [facility] at 858-391-5721 or Live Well Line at 669-785-4241. You may also visit [email or web address] or email [email or web address] for more information on other programs.   Next appointment: Follow up in one year for your annual wellness visit.    Preventive Care 72 Years and Older, Female Preventive care refers to lifestyle choices and visits with your health care provider that can promote health and wellness. What does preventive care include? A yearly physical exam. This is also called an annual well check. Dental exams once or twice a year. Routine eye exams. Ask your health care provider how often you should have your eyes checked. Personal lifestyle choices, including: Daily care of your teeth and gums. Regular physical  activity. Eating a healthy diet. Avoiding tobacco and drug use. Limiting alcohol use. Practicing safe sex. Taking low-dose aspirin every day. Taking vitamin and mineral supplements as recommended by your health care provider. What happens during an annual well check? The services and screenings done by your health care provider during your annual well check will depend on your age, overall health, lifestyle risk factors, and family history of disease. Counseling  Your health care provider may ask you questions about your: Alcohol use. Tobacco use. Drug use. Emotional well-being. Home and relationship well-being. Sexual activity. Eating habits. History of falls. Memory and ability to understand (cognition). Work and work Statistician. Reproductive health. Screening  You may have the following tests or measurements: Height, weight, and BMI. Blood pressure. Lipid and cholesterol levels. These may be checked every 5 years, or more frequently if you are over 70 years old. Skin check. Lung cancer screening. You may have this screening every year starting at age 70 if you have a 30-pack-year history of smoking and currently smoke or have quit within the past 15 years. Fecal occult blood test (FOBT) of the stool. You may have this test every year starting at age 72. Flexible sigmoidoscopy or colonoscopy. You may have a sigmoidoscopy every 5 years or a colonoscopy every 10 years starting at age 82. Hepatitis C blood test. Hepatitis B blood test. Sexually transmitted disease (STD) testing. Diabetes screening. This is done by checking your blood sugar (glucose) after you have not eaten for a while (fasting). You may have this done every 1-3 years. Bone density scan. This is done to screen for osteoporosis. You may have this done starting at age 72. Mammogram. This may be done every 1-2 years. Talk to your health care provider about how  often you should have regular mammograms. Talk with your  health care provider about your test results, treatment options, and if necessary, the need for more tests. Vaccines  Your health care provider may recommend certain vaccines, such as: Influenza vaccine. This is recommended every year. Tetanus, diphtheria, and acellular pertussis (Tdap, Td) vaccine. You may need a Td booster every 10 years. Zoster vaccine. You may need this after age 3. Pneumococcal 13-valent conjugate (PCV13) vaccine. One dose is recommended after age 71. Pneumococcal polysaccharide (PPSV23) vaccine. One dose is recommended after age 72. Talk to your health care provider about which screenings and vaccines you need and how often you need them. This information is not intended to replace advice given to you by your health care provider. Make sure you discuss any questions you have with your health care provider. Document Released: 02/25/2015 Document Revised: 10/19/2015 Document Reviewed: 11/30/2014 Elsevier Interactive Patient Education  2017 [place] Prevention in the Home Falls can cause injuries. They can happen to people of all ages. There are many things you can do to make your home safe and to help prevent falls. What can I do on the outside of my home? Regularly fix the edges of walkways and driveways and fix any cracks. Remove anything that might make you trip as you walk through a door, such as a raised step or threshold. Trim any bushes or trees on the path to your home. Use bright outdoor lighting. Clear any walking paths of anything that might make someone trip, such as rocks or tools. Regularly check to see if handrails are loose or broken. Make sure that both sides of any steps have handrails. Any raised decks and porches should have guardrails on the edges. Have any leaves, snow, or ice cleared regularly. Use sand or salt on walking paths during winter. Clean up any spills in your garage right away. This includes oil or grease spills. What can I  do in the bathroom? Use night lights. Install grab bars by the toilet and in the tub and shower. Do not use towel bars as grab bars. Use non-skid mats or decals in the tub or shower. If you need to sit down in the shower, use a plastic, non-slip stool. Keep the floor dry. Clean up any water that spills on the floor as soon as it happens. Remove soap buildup in the tub or shower regularly. Attach bath mats securely with double-sided non-slip rug tape. Do not have throw rugs and other things on the floor that can make you trip. What can I do in the bedroom? Use night lights. Make sure that you have a light by your bed that is easy to reach. Do not use any sheets or blankets that are too big for your bed. They should not hang down onto the floor. Have a firm chair that has side arms. You can use this for support while you get dressed. Do not have throw rugs and other things on the floor that can make you trip. What can I do in the kitchen? Clean up any spills right away. Avoid walking on wet floors. Keep items that you use a lot in easy-to-reach places. If you need to reach something above you, use a strong step stool that has a grab bar. Keep electrical cords out of the way. Do not use floor polish or wax that makes floors slippery. If you must use wax, use non-skid floor wax. Do not have throw rugs and other things  on the floor that can make you trip. What can I do with my stairs? Do not leave any items on the stairs. Make sure that there are handrails on both sides of the stairs and use them. Fix handrails that are broken or loose. Make sure that handrails are as long as the stairways. Check any carpeting to make sure that it is firmly attached to the stairs. Fix any carpet that is loose or worn. Avoid having throw rugs at the top or bottom of the stairs. If you do have throw rugs, attach them to the floor with carpet tape. Make sure that you have a light switch at the top of the stairs  and the bottom of the stairs. If you do not have them, ask someone to add them for you. What else can I do to help prevent falls? Wear shoes that: Do not have high heels. Have rubber bottoms. Are comfortable and fit you well. Are closed at the toe. Do not wear sandals. If you use a stepladder: Make sure that it is fully opened. Do not climb a closed stepladder. Make sure that both sides of the stepladder are locked into place. Ask someone to hold it for you, if possible. Clearly mark and make sure that you can see: Any grab bars or handrails. First and last steps. Where the edge of each step is. Use tools that help you move around (mobility aids) if they are needed. These include: Canes. Walkers. Scooters. Crutches. Turn on the lights when you go into a dark area. Replace any light bulbs as soon as they burn out. Set up your furniture so you have a clear path. Avoid moving your furniture around. If any of your floors are uneven, fix them. If there are any pets around you, be aware of where they are. Review your medicines with your doctor. Some medicines can make you feel dizzy. This can increase your chance of falling. Ask your doctor what other things that you can do to help prevent falls. This information is not intended to replace advice given to you by your health care provider. Make sure you discuss any questions you have with your health care provider. Document Released: 11/25/2008 Document Revised: 07/07/2015 Document Reviewed: 03/05/2014 Elsevier Interactive Patient Education  2017 [employer].

## 2020-12-22 NOTE — Progress Notes (Signed)
Subjective:   Megan Crawford is a 70 y.o. female who presents for Medicare Annual (Subsequent) preventive examination.  Virtual Visit via Telephone Note  I connected with  CYDNEY ALVARENGA on 12/22/20 at  8:00 AM EST by telephone and verified that I am speaking with the correct person using two identifiers.  Location: Patient: home Provider: [place] Persons participating in the virtual visit: [place]   I discussed the limitations, risks, security and privacy concerns of performing an evaluation and management service by telephone and the availability of in person appointments. The patient expressed understanding and agreed to proceed.  Interactive audio and video telecommunications were attempted between this nurse and patient, however failed, due to patient having technical difficulties OR patient did not have access to video capability.  We continued and completed visit with audio only.  Some vital signs may be absent or patient reported.   Clemetine Marker, LPN   Review of Systems     Cardiac Risk Factors include: advanced age (>33men, >60 women);smoking/ tobacco exposure;obesity (BMI >30kg/m2)     Objective:    There were no vitals filed for this visit. There is no height or weight on file to calculate BMI.  Advanced Directives 12/22/2020 12/22/2019 09/12/2018 06/25/2017 06/04/2017  Does Patient Have a Medical Advance Directive? No No No No No  Would patient like information on creating a medical advance directive? No - Patient declined No - Patient declined No - Patient declined - Yes (MAU/Ambulatory/Procedural Areas - Information given)    Current Medications (verified) No outpatient encounter medications on file as of 12/22/2020.   No facility-administered encounter medications on file as of 12/22/2020.    Allergies (verified) Patient has no known allergies.   History: Past Medical History:  Diagnosis Date   Cardiac murmur    Fatigue    Tobacco use     Past Surgical History:  Procedure Laterality Date   COLONOSCOPY WITH PROPOFOL N/A 06/25/2017   Procedure: COLONOSCOPY WITH PROPOFOL;  Surgeon: [name], MD;  Location: [facility] ENDOSCOPY;  Service: Endoscopy;  Laterality: N/A;   HERNIA REPAIR  1970's   Family History  Problem Relation Age of Onset   Alzheimer's disease Mother    Heart attack Father    Cancer Father        ? prostate cancer   Healthy Sister    Diabetes Brother    Cancer Brother        colon and lung   Healthy Son    Healthy Sister    Healthy Sister    Breast cancer Neg Hx    Social History   Socioeconomic History   Marital status: Divorced    Spouse name: Not on file   Number of children: 2   Years of education: Not on file   Highest education level: 12th grade  Occupational History   Occupation: Retired  Tobacco Use   Smoking status: Every Day    Packs/day: 2.00    Years: 52.00    Pack years: 104.00    Types: Cigarettes   Smokeless tobacco: Never   Tobacco comments:    pt declines information about quitting  Vaping Use   Vaping Use: Never used  Substance and Sexual Activity   Alcohol use: No   Drug use: No   Sexual activity: Not Currently    Partners: Male  Other Topics Concern   Not on file  Social History Narrative   Not on file   Social Determinants of Health  Financial Resource Strain: Low Risk    Difficulty of Paying Living Expenses: Not hard at all  Food Insecurity: No Food Insecurity   Worried About Charity fundraiser in the Last Year: Never true   Ran Out of Food in the Last Year: Never true  Transportation Needs: No Transportation Needs   Lack of Transportation (Medical): No   Lack of Transportation (Non-Medical): No  Physical Activity: Inactive   Days of Exercise per Week: 0 days   Minutes of Exercise per Session: 0 min  Stress: No Stress Concern Present   Feeling of Stress : Not at all  Social Connections: Socially Isolated   Frequency of Communication with Friends  and Family: More than three times a week   Frequency of Social Gatherings with Friends and Family: Once a week   Attends Religious Services: Never   Marine scientist or Organizations: No   Attends Music therapist: Never   Marital Status: Divorced    Tobacco Counseling Ready to quit: Not Answered Counseling given: Not Answered Tobacco comments: pt declines information about quitting   Clinical Intake:  Pre-visit preparation completed: Yes  Pain : No/denies pain     Nutritional Status: BMI > 30  Obese Nutritional Risks: None Diabetes: No  How often do you need to have someone help you when you read instructions, pamphlets, or other written materials from your doctor or pharmacy?: 1 - Never    Interpreter Needed?: No  Information entered by :: Clemetine Marker LPN   Activities of Daily Living In your present state of health, do you have any difficulty performing the following activities: 12/22/2020 01/19/2020  Hearing? N N  Vision? N N  Difficulty concentrating or making decisions? N N  Walking or climbing stairs? N N  Dressing or bathing? N N  Doing errands, shopping? N N  Preparing Food and eating ? N -  Using the Toilet? N -  In the past six months, have you accidently leaked urine? Y -  Comment occasional urge incontinence, wears pads for protection -  Do you have problems with loss of bowel control? N -  Managing your Medications? N -  Managing your Finances? N -  Housekeeping or managing your Housekeeping? N -  Some recent data might be hidden    Patient Care Team: Bo Merino, FNP as PCP - General (Nurse Practitioner)  Indicate any recent Medical Services you may have received from other than [facility] providers in the past year (date may be approximate).     Assessment:   This is a routine wellness examination for Megan Crawford.  Hearing/Vision screen Hearing Screening - Comments:: Pt denies hearing difficulty Vision Screening - Comments::  Annual vision screenings done at [facility]  Dietary issues and exercise activities discussed: Current Exercise Habits: The patient does not participate in regular exercise at present, Exercise limited by: None identified   Goals Addressed             This Visit's Progress    Quit Smoking   Not on track    If you wish to quit smoking, help is available. For free tobacco cessation program offerings call the [facility] at 509-124-5698 or Live Well Line at 276-427-3331. You may also visit [email or web address] or email [email or web address] for more information on other programs.         Depression Screen PHQ 2/9 Scores 12/22/2020 01/19/2020 12/22/2019 11/18/2019 09/12/2018 06/20/2018 06/17/2017  PHQ - 2 Score  0 0 0 0 0 1 0  PHQ- 9 Score - - - - 1 1 -    Fall Risk Fall Risk  12/22/2020 01/19/2020 12/22/2019 11/18/2019 09/12/2018  Falls in the past year? 0 0 0 0 0  Number falls in past yr: 0 0 0 0 0  Injury with Fall? 0 0 0 0 0  Risk for fall due to : No Fall Risks - No Fall Risks - -  Risk for fall due to: Comment - - - - -  Follow up Falls prevention discussed Falls evaluation completed Falls prevention discussed - Falls prevention discussed    FALL RISK PREVENTION PERTAINING TO THE HOME:  Any stairs in or around the home? Yes  If so, are there any without handrails? No  Home free of loose throw rugs in walkways, pet beds, electrical cords, etc? Yes  Adequate lighting in your home to reduce risk of falls? Yes   ASSISTIVE DEVICES UTILIZED TO PREVENT FALLS:  Life alert? No  Use of a cane, walker or w/c? No  Grab bars in the bathroom? No  Shower chair or bench in shower? No  Elevated toilet seat or a handicapped toilet? Yes   TIMED UP AND GO:  Was the test performed? No . Telephonic visit.   Cognitive Function: Normal cognitive status assessed by direct observation by this Nurse Health Advisor. No abnormalities found.       6CIT Screen 12/22/2019  09/12/2018 06/04/2017  What Year? 0 points 0 points 0 points  What month? 0 points 0 points 0 points  What time? 0 points 0 points 0 points  Count back from 20 0 points 0 points 0 points  Months in reverse 0 points 0 points 0 points  Repeat phrase 0 points 0 points 0 points  Total Score 0 0 0    Immunizations Immunization History  Administered Date(s) Administered   Fluad Quad(high Dose 65+) 10/28/2018, 11/18/2019   Influenza, High Dose Seasonal PF 12/15/2016, 12/07/2017, 11/30/2020   Influenza-Unspecified 11/28/2016   PFIZER(Purple Top)SARS-COV-2 Vaccination 03/09/2019, 03/30/2019, 12/03/2019   Pneumococcal Conjugate-13 06/04/2017   Pneumococcal Polysaccharide-23 10/28/2018    TDAP status: Due, Education has been provided regarding the importance of this vaccine. Advised may receive this vaccine at local pharmacy or Health Dept. Aware to provide a copy of the vaccination record if obtained from local pharmacy or Health Dept. Verbalized acceptance and understanding.  Flu Vaccine status: Up to date  Pneumococcal vaccine status: Up to date  Covid-19 vaccine status: Completed vaccines  Qualifies for Shingles Vaccine? Yes   Zostavax completed No   Shingrix Completed?: No.    Education has been provided regarding the importance of this vaccine. Patient has been advised to call insurance company to determine out of pocket expense if they have not yet received this vaccine. Advised may also receive vaccine at local pharmacy or Health Dept. Verbalized acceptance and understanding.  Screening Tests Health Maintenance  Topic Date Due   Hepatitis C Screening  Never done   TETANUS/TDAP  Never done   Zoster Vaccines- Shingrix (1 of 2) Never done   COVID-19 Vaccine (4 - Booster for Pfizer series) 01/28/2020   MAMMOGRAM  02/24/2021   DEXA SCAN  02/24/2022   COLONOSCOPY (Pts 45-40yrs Insurance coverage will need to be confirmed)  06/26/2022   Pneumonia Vaccine 19+ Years old  Completed    INFLUENZA VACCINE  Completed   HPV VACCINES  Aged Out    Health Maintenance  Health Maintenance Due  Topic Date Due   Hepatitis C Screening  Never done   TETANUS/TDAP  Never done   Zoster Vaccines- Shingrix (1 of 2) Never done   COVID-19 Vaccine (4 - Booster for Pfizer series) 01/28/2020    Colorectal cancer screening: Type of screening: Colonoscopy. Completed 06/25/17. Repeat every 5 years  Mammogram status: Completed 02/25/20. Repeat every year  Bone Density status: Completed 02/25/20. Results reflect: Bone density results: OSTEOPENIA. Repeat every 2 years.  Lung Cancer Screening: (Low Dose CT Chest recommended if Age 74-80 years, 30 pack-year currently smoking OR have quit w/in 15years.) does qualify.   Lung Cancer Screening Referral: A referral has been sent to [facility] Pulmonary Lung Cancer Screening regarding the possible need for this exam. The patient's chart will be reviewed to determine if they qualify and the patient will be contacted to facilitate the scheduling of the Low Dose Chest CT for lung cancer screening.    Additional Screening:  Hepatitis C Screening: does qualify; postponed  Vision Screening: Recommended annual ophthalmology exams for early detection of glaucoma and other disorders of the eye. Is the patient up to date with their annual eye exam?  Yes  Who is the provider or what is the name of the office in which the patient attends annual eye exams? [facility].   Dental Screening: Recommended annual dental exams for proper oral hygiene  Community Resource Referral / Chronic Care Management: CRR required this visit?  No   CCM required this visit?  No      Plan:     I have personally reviewed and noted the following in the patient's chart:   Medical and social history Use of alcohol, tobacco or illicit drugs  Current medications and supplements including opioid prescriptions.  Functional ability and status Nutritional status Physical  activity Advanced directives List of other physicians Hospitalizations, surgeries, and ER visits in previous 12 months Vitals Screenings to include cognitive, depression, and falls Referrals and appointments  In addition, I have reviewed and discussed with patient certain preventive protocols, quality metrics, and best practice recommendations. A written personalized care plan for preventive services as well as general preventive health recommendations were provided to patient.     Clemetine Marker, LPN   16/08/[id number]   Nurse Notes: none

## 2021-01-19 ENCOUNTER — Ambulatory Visit (INDEPENDENT_AMBULATORY_CARE_PROVIDER_SITE_OTHER): Payer: Medicare Other | Admitting: Nurse Practitioner

## 2021-01-19 ENCOUNTER — Encounter: Payer: Self-pay | Admitting: Nurse Practitioner

## 2021-01-19 ENCOUNTER — Other Ambulatory Visit: Payer: Self-pay

## 2021-01-19 VITALS — BP 124/78 | HR 77 | Temp 98.5°F | Resp 16 | Ht 60.0 in | Wt 170.1 lb

## 2021-01-19 DIAGNOSIS — E785 Hyperlipidemia, unspecified: Secondary | ICD-10-CM | POA: Diagnosis not present

## 2021-01-19 DIAGNOSIS — Z720 Tobacco use: Secondary | ICD-10-CM | POA: Diagnosis not present

## 2021-01-19 DIAGNOSIS — E66811 Obesity, class 1: Secondary | ICD-10-CM

## 2021-01-19 DIAGNOSIS — Z0000 Encounter for general adult medical examination without abnormal findings: Secondary | ICD-10-CM

## 2021-01-19 DIAGNOSIS — Z122 Encounter for screening for malignant neoplasm of respiratory organs: Secondary | ICD-10-CM

## 2021-01-19 DIAGNOSIS — E669 Obesity, unspecified: Secondary | ICD-10-CM | POA: Diagnosis not present

## 2021-01-19 DIAGNOSIS — Z130 Encounter for screening for diseases of the blood and blood-forming organs and certain disorders involving the immune mechanism: Secondary | ICD-10-CM

## 2021-01-19 NOTE — Progress Notes (Signed)
Name: Megan Crawford   MRN: [id number]    DOB: [birth year]   Date:01/19/2021       Progress Note  Subjective  Chief Complaint  Chief Complaint  Patient presents with   Annual Exam    HPI  Patient presents for annual CPE.  Diet: does not eat fruit otherwise well balanced Exercise: walking, housework and yard work, discussed recommendations of getting 150 min a week  Megan Crawford from 12/22/2020 in [facility]  AUDIT-C Score 0      Depression: Phq 9 is  negative Depression screen [facility] 2/9 01/19/2021 12/22/2020 01/19/2020 12/22/2019 11/18/2019  Decreased Interest 0 0 0 0 -  Down, Depressed, Hopeless 0 0 0 0 0  PHQ - 2 Score 0 0 0 0 0  Altered sleeping 0 - - - -  Tired, decreased energy 0 - - - -  Change in appetite 0 - - - -  Feeling bad or failure about yourself  0 - - - -  Trouble concentrating 0 - - - -  Moving slowly or fidgety/restless 0 - - - -  Suicidal thoughts 0 - - - -  PHQ-9 Score 0 - - - -  Difficult doing work/chores Not difficult at all - - - -   Hypertension: BP Readings from Last 3 Encounters:  01/19/21 124/78  01/19/20 124/78  11/18/19 (!) 144/60   Obesity: Wt Readings from Last 3 Encounters:  01/19/21 170 lb 1.6 oz (77.2 kg)  01/19/20 169 lb 4.8 oz (76.8 kg)  12/03/19 169 lb (76.7 kg)   BMI Readings from Last 3 Encounters:  01/19/21 33.22 kg/m  01/19/20 33.06 kg/m  12/03/19 33.01 kg/m     Vaccines:  HPV: up to at age 73 , ask insurance if age between 53-45  Shingrix: 61-64 yo and ask insurance if covered when patient above 67 yo Pneumonia: educated and discussed with patient. Flu:  educated and discussed with patient.  Hep C Screening: declined STD testing and prevention (HIV/chl/gon/syphilis): declined Intimate partner violence:none Sexual History : not sexual active Menstrual History/LMP/Abnormal Bleeding: postmenopausal  Incontinence Symptoms: occasional stress incontinence  Breast cancer:  - Last  Mammogram: 02/25/20, new order was placed at last visit - BRCA gene screening: none  Osteoporosis: Discussed high calcium and vitamin D supplementation, weight bearing exercises  Cervical cancer screening: aged out, no family history  Skin cancer: Discussed monitoring for atypical lesions  Colorectal cancer: 06/25/2017 Lung cancer:  Low Dose CT Chest recommended if Age 12-80 years, 47 pack-year currently smoking OR have quit w/in 15years. Patient does qualify.  ordered ECG: 07/14/2013  Advanced Care Planning: A voluntary discussion about advance care planning including the explanation and discussion of advance directives.  Discussed health care proxy and Living will, and the patient was able to identify a health care proxy as she has not chosen anyone  Lipids: Lab Results  Component Value Date   CHOL 206 (H) 11/18/2019   CHOL 192 04/25/2017   Lab Results  Component Value Date   HDL 53 11/18/2019   HDL 62 04/25/2017   Lab Results  Component Value Date   LDLCALC 131 (H) 11/18/2019   LDLCALC 108 (H) 04/25/2017   Lab Results  Component Value Date   TRIG 114 11/18/2019   TRIG 117 04/25/2017   Lab Results  Component Value Date   CHOLHDL 3.9 11/18/2019   CHOLHDL 3.1 04/25/2017   No results found for: LDLDIRECT  Glucose: Glucose, Bld  Date Value  Ref Range Status  11/18/2019 101 (H) 65 - 99 mg/dL Final    Comment:    .            Fasting reference interval . For someone without known diabetes, a glucose value between 100 and 125 mg/dL is consistent with prediabetes and should be confirmed with a follow-up test. .   04/25/2017 93 65 - 139 mg/dL Final    Comment:    .        Non-fasting reference interval .     Patient Active Problem List   Diagnosis Date Noted   Elevated BP without diagnosis of hypertension 11/18/2019   Hyperlipidemia 11/18/2019   Pulmonary emphysema ([place]) 11/18/2019   Aortic atherosclerosis ([place]) 06/20/2018   Colon cancer screening    Polyp  of sigmoid colon    Mitral regurgitation and aortic stenosis 06/17/2017   Aortic valve stenosis, mild 04/25/2017   Obesity (BMI 30.0-34.9) 04/25/2017   Bright red blood per rectum 04/25/2017   Borderline diabetes 04/25/2017   Heart murmur 07/14/2013   Tobacco use     Past Surgical History:  Procedure Laterality Date   COLONOSCOPY WITH PROPOFOL N/A 06/25/2017   Procedure: COLONOSCOPY WITH PROPOFOL;  Surgeon: [name], MD;  Location[facility] ENDOSCOPY;  Service: Endoscopy;  Laterality: N/A;   HERNIA REPAIR  1970's    Family History  Problem Relation Age of Onset   Alzheimer's disease Mother    Heart attack Father    Cancer Father        ? prostate cancer   Healthy Sister    Diabetes Brother    Cancer Brother        colon and lung   Healthy Son    Healthy Sister    Healthy Sister    Breast cancer Neg Hx     Social History   Socioeconomic History   Marital status: Divorced    Spouse name: Not on file   Number of children: 2   Years of education: Not on file   Highest education level: 12th grade  Occupational History   Occupation: Retired  Tobacco Use   Smoking status: Every Day    Packs/day: 2.00    Years: 52.00    Pack years: 104.00    Types: Cigarettes   Smokeless tobacco: Never   Tobacco comments:    pt declines information about quitting  Vaping Use   Vaping Use: Never used  Substance and Sexual Activity   Alcohol use: No   Drug use: No   Sexual activity: Not Currently    Partners: Male  Other Topics Concern   Not on file  Social History Narrative   Not on file   Social Determinants of Health   Financial Resource Strain: Low Risk    Difficulty of Paying Living Expenses: Not hard at all  Food Insecurity: No Food Insecurity   Worried About Charity fundraiser in the Last Year: Never true   Ran Out of Food in the Last Year: Never true  Transportation Needs: No Transportation Needs   Lack of Transportation (Medical): No   Lack of Transportation  (Non-Medical): No  Physical Activity: Insufficiently Active   Days of Exercise per Week: 3 days   Minutes of Exercise per Session: 30 min  Stress: No Stress Concern Present   Feeling of Stress : Not at all  Social Connections: Moderately Isolated   Frequency of Communication with Friends and Family: Twice a week   Frequency of Social  Gatherings with Friends and Family: Twice a week   Attends Religious Services: More than 4 times per year   Active Member of [employer] or Organizations: No   Attends Archivist Meetings: Never   Marital Status: Divorced  Human resources officer Violence: Not At Risk   Fear of Current or Ex-Partner: No   Emotionally Abused: No   Physically Abused: No   Sexually Abused: No    No current outpatient medications on file.  No Known Allergies   ROS  Constitutional: Negative for fever or weight change.  Respiratory: Negative for cough and shortness of breath.   Cardiovascular: Negative for chest pain or palpitations.  Gastrointestinal: Negative for abdominal pain, no bowel changes.  Musculoskeletal: Negative for gait problem or joint swelling.  Skin: Negative for rash.  Neurological: Negative for dizziness or headache.  No other specific complaints in a complete review of systems (except as listed in HPI above).   Objective  Vitals:   01/19/21 0832  BP: 124/78  Pulse: 77  Resp: 16  Temp: 98.5 F (36.9 C)  SpO2: 96%  Weight: 170 lb 1.6 oz (77.2 kg)  Height: 5' (1.524 m)    Body mass index is 33.22 kg/m.  Physical Exam Constitutional: Patient appears well-developed and well-nourished. No distress.  HENT: Head: Normocephalic and atraumatic. Ears: B TMs ok, no erythema or effusion; Nose: Nose normal. Mouth/Throat: not done Eyes: Conjunctivae and EOM are normal. Pupils are equal, round, and reactive to light. No scleral icterus.  Neck: Normal range of motion. Neck supple. No JVD present. No thyromegaly present.  Cardiovascular: Normal rate,  regular rhythm and normal heart sounds.  No murmur heard. No BLE edema. Pulmonary/Chest: Effort normal and breath sounds normal. No respiratory distress. Abdominal: Soft. Bowel sounds are normal, no distension. There is no tenderness. no masses Breast: no lumps or masses, no nipple discharge or rashes FEMALE GENITALIA: not done RECTAL: not done Musculoskeletal: Normal range of motion, no joint effusions. No gross deformities Neurological: he is alert and oriented to person, place, and time. No cranial nerve deficit. Coordination, balance, strength, speech and gait are normal.  Skin: Skin is warm and dry. No rash noted. No erythema.  Psychiatric: Patient has a normal mood and affect. behavior is normal. Judgment and thought content normal.   Fall Risk: Fall Risk  01/19/2021 12/22/2020 01/19/2020 12/22/2019 11/18/2019  Falls in the past year? 0 0 0 0 0  Number falls in past yr: 0 0 0 0 0  Injury with Fall? 0 0 0 0 0  Risk for fall due to : - No Fall Risks - No Fall Risks -  Risk for fall due to: Comment - - - - -  Follow up - Falls prevention discussed Falls evaluation completed Falls prevention discussed -     Functional Status Survey: Is the patient deaf or have difficulty hearing?: No Does the patient have difficulty seeing, even when wearing glasses/contacts?: No Does the patient have difficulty concentrating, remembering, or making decisions?: No Does the patient have difficulty walking or climbing stairs?: No Does the patient have difficulty dressing or bathing?: No Does the patient have difficulty doing errands alone such as visiting a doctor's office or shopping?: No  Assessment & Plan  1. Annual physical exam - COMPLETE METABOLIC PANEL WITH GFR - Lipid panel - CBC with Differential/Platelet  2. Hyperlipidemia, unspecified hyperlipidemia type  - COMPLETE METABOLIC PANEL WITH GFR - Lipid panel  3. Obesity (BMI 30.0-34.9) -increase physical activity  4.  Tobacco use  -  Ambulatory Referral Lung Cancer Screening [address] [facility]  5. Screening for lung cancer  - Ambulatory Referral Lung Cancer Screening [facility]  6. Screening for deficiency anemia  - CBC with Differential/Platelet    -USPSTF grade A and B recommendations reviewed with patient; age-appropriate recommendations, preventive care, screening tests, etc discussed and encouraged; healthy living encouraged; see AVS for patient education given to patient -Discussed importance of 150 minutes of physical activity weekly, eat two servings of fish weekly, eat one serving of tree nuts ( cashews, pistachios, pecans, almonds.Marland Kitchen) every other day, eat 6 servings of fruit/vegetables daily and drink plenty of water and avoid sweet beverages.

## 2021-01-20 LAB — CBC WITH DIFFERENTIAL/PLATELET
Absolute Monocytes: 472 cells/uL (ref 200–950)
Basophils Absolute: 59 cells/uL (ref 0–200)
Basophils Relative: 1 %
Eosinophils Absolute: 242 cells/uL (ref 15–500)
Eosinophils Relative: 4.1 %
HCT: 41.4 % (ref 35.0–45.0)
Hemoglobin: 14.1 g/dL (ref 11.7–15.5)
Lymphs Abs: 2136 cells/uL (ref 850–3900)
MCH: 34.3 pg — ABNORMAL HIGH (ref 27.0–33.0)
MCHC: 34.1 g/dL (ref 32.0–36.0)
MCV: 100.7 fL — ABNORMAL HIGH (ref 80.0–100.0)
MPV: 10.2 fL (ref 7.5–12.5)
Monocytes Relative: 8 %
Neutro Abs: 2991 cells/uL (ref 1500–7800)
Neutrophils Relative %: 50.7 %
Platelets: 229 10*3/uL (ref 140–400)
RBC: 4.11 10*6/uL (ref 3.80–5.10)
RDW: 11.7 % (ref 11.0–15.0)
Total Lymphocyte: 36.2 %
WBC: 5.9 10*3/uL (ref 3.8–10.8)

## 2021-01-20 LAB — COMPLETE METABOLIC PANEL WITH GFR
AG Ratio: 1.6 (calc) (ref 1.0–2.5)
ALT: 12 U/L (ref 6–29)
AST: 12 U/L (ref 10–35)
Albumin: 3.8 g/dL (ref 3.6–5.1)
Alkaline phosphatase (APISO): 97 U/L (ref 37–153)
BUN: 9 mg/dL (ref 7–25)
CO2: 28 mmol/L (ref 20–32)
Calcium: 9.2 mg/dL (ref 8.6–10.4)
Chloride: 106 mmol/L (ref 98–110)
Creat: 0.8 mg/dL (ref 0.60–1.00)
Globulin: 2.4 g/dL (calc) (ref 1.9–3.7)
Glucose, Bld: 101 mg/dL — ABNORMAL HIGH (ref 65–99)
Potassium: 4.4 mmol/L (ref 3.5–5.3)
Sodium: 142 mmol/L (ref 135–146)
Total Bilirubin: 0.4 mg/dL (ref 0.2–1.2)
Total Protein: 6.2 g/dL (ref 6.1–8.1)
eGFR: 78 mL/min/{1.73_m2} (ref >=60)

## 2021-01-20 LAB — LIPID PANEL
Cholesterol: 191 mg/dL (ref <200)
HDL: 58 mg/dL (ref >=50)
LDL Cholesterol (Calc): 112 mg/dL (calc) — ABNORMAL HIGH
Non-HDL Cholesterol (Calc): 133 mg/dL (calc) — ABNORMAL HIGH (ref <130)
Total CHOL/HDL Ratio: 3.3 (calc) (ref <5.0)
Triglycerides: 105 mg/dL (ref <150)

## 2021-03-21 ENCOUNTER — Telehealth: Payer: Self-pay | Admitting: Acute Care

## 2021-03-21 NOTE — Telephone Encounter (Signed)
Left voicemail and call back number to call and schedule LDCT

## 2021-04-04 ENCOUNTER — Encounter: Payer: Self-pay | Admitting: Ophthalmology

## 2021-04-06 NOTE — Discharge Instructions (Signed)

## 2021-04-11 ENCOUNTER — Other Ambulatory Visit: Payer: Self-pay

## 2021-04-11 ENCOUNTER — Ambulatory Visit: Payer: Medicare Other | Admitting: Anesthesiology

## 2021-04-11 ENCOUNTER — Encounter: Payer: Self-pay | Admitting: Ophthalmology

## 2021-04-11 ENCOUNTER — Ambulatory Visit
Admission: RE | Admit: 2021-04-11 | Discharge: 2021-04-11 | Disposition: A | Discharge status: Home / Self Care | Payer: Medicare Other | Attending: Ophthalmology | Admitting: Ophthalmology

## 2021-04-11 ENCOUNTER — Encounter
Admission: RE | Disposition: A | Discharge status: Home / Self Care | Payer: Self-pay | Source: Home / Self Care | Attending: Ophthalmology

## 2021-04-11 DIAGNOSIS — J449 Chronic obstructive pulmonary disease, unspecified: Secondary | ICD-10-CM | POA: Diagnosis not present

## 2021-04-11 DIAGNOSIS — F17210 Nicotine dependence, cigarettes, uncomplicated: Secondary | ICD-10-CM | POA: Insufficient documentation

## 2021-04-11 DIAGNOSIS — H2511 Age-related nuclear cataract, right eye: Secondary | ICD-10-CM | POA: Diagnosis present

## 2021-04-11 HISTORY — PX: CATARACT EXTRACTION W/PHACO: SHX586

## 2021-04-11 SURGERY — PHACOEMULSIFICATION, CATARACT, WITH IOL INSERTION
Anesthesia: Monitor Anesthesia Care | Site: Eye | Laterality: Right

## 2021-04-11 MED ORDER — LACTATED RINGERS IV SOLN: INTRAVENOUS | Status: DC

## 2021-04-11 MED ORDER — MIDAZOLAM HCL 2 MG/2ML IJ SOLN
INTRAMUSCULAR | Status: DC | PRN
  Administered 2021-04-11: 1 mg via INTRAVENOUS

## 2021-04-11 MED ORDER — MOXIFLOXACIN HCL 0.5 % OP SOLN
OPHTHALMIC | Status: DC | PRN
Start: 2021-04-11 — End: 2021-04-11
  Administered 2021-04-11: 0.2 mL via OPHTHALMIC

## 2021-04-11 MED ORDER — BRIMONIDINE TARTRATE-TIMOLOL 0.2-0.5 % OP SOLN
OPHTHALMIC | Status: DC | PRN
  Administered 2021-04-11: 1 [drp] via OPHTHALMIC

## 2021-04-11 MED ORDER — SIGHTPATH DOSE#1 BSS IO SOLN
INTRAOCULAR | Status: DC | PRN
  Administered 2021-04-11: 52 mL via OPHTHALMIC

## 2021-04-11 MED ORDER — SIGHTPATH DOSE#1 BSS IO SOLN
INTRAOCULAR | Status: DC | PRN
Start: 2021-04-11 — End: 2021-04-11
  Administered 2021-04-11: 15 mL

## 2021-04-11 MED ORDER — FENTANYL CITRATE (PF) 100 MCG/2ML IJ SOLN
INTRAMUSCULAR | Status: DC | PRN
  Administered 2021-04-11: 50 ug via INTRAVENOUS

## 2021-04-11 MED ORDER — LIDOCAINE HCL (PF) 2 % IJ SOLN
INTRAMUSCULAR | Status: DC | PRN
  Administered 2021-04-11: 1 mL via INTRAMUSCULAR

## 2021-04-11 MED ORDER — SIGHTPATH DOSE#1 NA CHONDROIT SULF-NA HYALURON 40-17 MG/ML IO SOLN
INTRAOCULAR | Status: DC | PRN
  Administered 2021-04-11: 1 mL via INTRAOCULAR

## 2021-04-11 MED ORDER — TETRACAINE HCL 0.5 % OP SOLN
1.0000 [drp] | OPHTHALMIC | Status: DC | PRN
  Administered 2021-04-11 (×3): 1 [drp] via OPHTHALMIC

## 2021-04-11 MED ORDER — ARMC OPHTHALMIC DILATING DROPS
1.0000 "application " | OPHTHALMIC | Status: DC | PRN
  Administered 2021-04-11 (×3): 1 via OPHTHALMIC

## 2021-04-11 SURGICAL SUPPLY — 10 items
CATARACT SUITE SIGHTPATH (MISCELLANEOUS) ×2 IMPLANT
FEE CATARACT SUITE SIGHTPATH (MISCELLANEOUS) ×1 IMPLANT
GLOVE SURG ENC TEXT LTX SZ8 (GLOVE) ×2 IMPLANT
GLOVE SURG TRIUMPH 8.0 PF LTX (GLOVE) ×2 IMPLANT
LENS IOL TECNIS EYHANCE 21.0 (Intraocular Lens) ×1 IMPLANT
NDL FILTER BLUNT 18X1 1/2 (NEEDLE) ×1 IMPLANT
NEEDLE FILTER BLUNT 18X 1/2SAF (NEEDLE) ×1
NEEDLE FILTER BLUNT 18X1 1/2 (NEEDLE) ×1 IMPLANT
SYR 3ML LL SCALE MARK (SYRINGE) ×2 IMPLANT
WATER STERILE IRR 250ML POUR (IV SOLUTION) ×2 IMPLANT

## 2021-04-11 NOTE — Transfer of Care (Signed)
Immediate Anesthesia Transfer of Care Note  Patient: Megan Crawford  Procedure(s) Performed: CATARACT EXTRACTION PHACO AND INTRAOCULAR LENS PLACEMENT (IOC) RIGHT 10.94 01:06.4 (Right: Eye)  Patient Location: PACU  Anesthesia Type: MAC  Level of Consciousness: awake, alert  and patient cooperative  Airway and Oxygen Therapy: Patient Spontanous Breathing and Patient connected to supplemental oxygen  Post-op Assessment: Post-op Vital signs reviewed, Patient's Cardiovascular Status Stable, Respiratory Function Stable, Patent Airway and No signs of Nausea or vomiting  Post-op Vital Signs: Reviewed and stable  Complications: No notable events documented.

## 2021-04-11 NOTE — H&P (Signed)
[facility]   Primary Care Physician:  Bo Merino, FNP Ophthalmologist: Dr. George Ina  Pre-Procedure History & Physical: HPI:  Megan Crawford is a 71 y.o. female here for cataract surgery.   Past Medical History:  Diagnosis Date   Cardiac murmur    Fatigue    Tobacco use     Past Surgical History:  Procedure Laterality Date   COLONOSCOPY WITH PROPOFOL N/A 06/25/2017   Procedure: COLONOSCOPY WITH PROPOFOL;  Surgeon: [name], MD;  Location: [facility] ENDOSCOPY;  Service: Endoscopy;  Laterality: N/A;   HERNIA REPAIR  1970's    Prior to Admission medications   Not on File    Allergies as of 03/03/2021   (No Known Allergies)    Family History  Problem Relation Age of Onset   Alzheimer's disease Mother    Heart attack Father    Cancer Father        ? prostate cancer   Healthy Sister    Diabetes Brother    Cancer Brother        colon and lung   Healthy Son    Healthy Sister    Healthy Sister    Breast cancer Neg Hx     Social History   Socioeconomic History   Marital status: Divorced    Spouse name: Not on file   Number of children: 2   Years of education: Not on file   Highest education level: 12th grade  Occupational History   Occupation: Retired  Tobacco Use   Smoking status: Every Day    Packs/day: 2.00    Years: 52.00    Pack years: 104.00    Types: Cigarettes   Smokeless tobacco: Never   Tobacco comments:    pt declines information about quitting  Vaping Use   Vaping Use: Never used  Substance and Sexual Activity   Alcohol use: No   Drug use: No   Sexual activity: Not Currently    Partners: Male  Other Topics Concern   Not on file  Social History Narrative   Not on file   Social Determinants of Health   Financial Resource Strain: Low Risk    Difficulty of Paying Living Expenses: Not hard at all  Food Insecurity: No Food Insecurity   Worried About Charity fundraiser in the Last Year: Never true   [place] in the Last  Year: Never true  Transportation Needs: No Transportation Needs   Lack of Transportation (Medical): No   Lack of Transportation (Non-Medical): No  Physical Activity: Insufficiently Active   Days of Exercise per Week: 3 days   Minutes of Exercise per Session: 30 min  Stress: No Stress Concern Present   Feeling of Stress : Not at all  Social Connections: Moderately Isolated   Frequency of Communication with Friends and Family: Twice a week   Frequency of Social Gatherings with Friends and Family: Twice a week   Attends Religious Services: More than 4 times per year   Active Member of [employer] or Organizations: No   Attends Music therapist: Never   Marital Status: Divorced  Human resources officer Violence: Not At Risk   Fear of Current or Ex-Partner: No   Emotionally Abused: No   Physically Abused: No   Sexually Abused: No    Review of Systems: See HPI, otherwise negative ROS  Physical Exam: BP (!) 147/60    Pulse 95    Temp (!) 97.5 F (36.4 C) (Temporal)  Resp 20    Ht 5' (1.524 m)    Wt 77.6 kg    LMP  (LMP Unknown)    SpO2 95%    BMI 33.40 kg/m  General:   Alert, cooperative in NAD Head:  Normocephalic and atraumatic. Respiratory:  Normal work of breathing. Cardiovascular:  RRR  Impression/Plan: Megan Crawford is here for cataract surgery.  Risks, benefits, limitations, and alternatives regarding cataract surgery have been reviewed with the patient.  Questions have been answered.  All parties agreeable.   Birder Robson, MD  04/11/2021, 7:55 AM

## 2021-04-11 NOTE — Op Note (Signed)
PREOPERATIVE DIAGNOSIS:  Nuclear sclerotic cataract of the right eye.   POSTOPERATIVE DIAGNOSIS:  H25.11 Cataract   OPERATIVE PROCEDURE:ORPROCALL@   SURGEON:  Megan Robson, MD.   ANESTHESIA:  Anesthesiologist: Rochel Brome, MD CRNA: Garner Nash, CRNA  1.      Managed anesthesia care. 2.      0.54ml of Shugarcaine was instilled in the eye following the paracentesis.   COMPLICATIONS:  None.   TECHNIQUE:   Stop and chop   DESCRIPTION OF PROCEDURE:  The patient was examined and consented in the preoperative holding area where the aforementioned topical anesthesia was applied to the right eye and then brought back to the Operating Room where the right eye was prepped and draped in the usual sterile ophthalmic fashion and a lid speculum was placed. A paracentesis was created with the side port blade and the anterior chamber was filled with viscoelastic. A near clear corneal incision was performed with the steel keratome. A continuous curvilinear capsulorrhexis was performed with a cystotome followed by the capsulorrhexis forceps. Hydrodissection and hydrodelineation were carried out with BSS on a blunt cannula. The lens was removed in a stop and chop  technique and the remaining cortical material was removed with the irrigation-aspiration handpiece. The capsular bag was inflated with viscoelastic and the Technis [id number]  lens was placed in the capsular bag without complication. The remaining viscoelastic was removed from the eye with the irrigation-aspiration handpiece. The wounds were hydrated. The anterior chamber was flushed with BSS and the eye was inflated to physiologic pressure. 0.31ml of Vigamox was placed in the anterior chamber. The wounds were found to be water tight. The eye was dressed with Combigan. The patient was given protective glasses to wear throughout the day and a shield with which to sleep tonight. The patient was also given drops with which to begin a drop regimen today and  will follow-up with me in one day. Implant Name Type Inv. Item Serial No. Manufacturer Lot No. LRB No. Used Action  LENS IOL TECNIS EYHANCE 21.0 - [id number] Intraocular Lens LENS IOL TECNIS EYHANCE 21.0 [id number] [employer]  Right 1 Implanted   Procedure(s): CATARACT EXTRACTION PHACO AND INTRAOCULAR LENS PLACEMENT (IOC) RIGHT 10.94 01:06.4 (Right)  Electronically signed: Birder Crawford 04/11/2021 8:22 AM

## 2021-04-11 NOTE — Anesthesia Preprocedure Evaluation (Signed)
Anesthesia Evaluation  Patient identified by MRN, date of birth, ID band Patient awake    Reviewed: Allergy & Precautions, H&P , NPO status , Patient's Chart, lab work & pertinent test results, reviewed documented beta blocker date and time   Airway Mallampati: II  TM Distance: >3 FB Neck ROM: full    Dental no notable dental hx.    Pulmonary COPD, Current Smoker,    Pulmonary exam normal breath sounds clear to auscultation       Cardiovascular Exercise Tolerance: Good Normal cardiovascular exam+ Valvular Problems/Murmurs MR and AS  Rhythm:regular Rate:Normal     Neuro/Psych negative neurological ROS  negative psych ROS   GI/Hepatic negative GI ROS, Neg liver ROS,   Endo/Other  negative endocrine ROS  Renal/GU negative Renal ROS  negative genitourinary   Musculoskeletal   Abdominal   Peds  Hematology negative hematology ROS (+)   Anesthesia Other Findings   Reproductive/Obstetrics negative OB ROS                             Anesthesia Physical Anesthesia Plan  ASA: 2  Anesthesia Plan: MAC   Post-op Pain Management:    Induction:   PONV Risk Score and Plan:   Airway Management Planned:   Additional Equipment:   Intra-op Plan:   Post-operative Plan:   Informed Consent: I have reviewed the patients History and Physical, chart, labs and discussed the procedure including the risks, benefits and alternatives for the proposed anesthesia with the patient or authorized representative who has indicated his/her understanding and acceptance.     Dental Advisory Given  Plan Discussed with: CRNA and Anesthesiologist  Anesthesia Plan Comments:         Anesthesia Quick Evaluation

## 2021-04-11 NOTE — Anesthesia Postprocedure Evaluation (Signed)
Anesthesia Post Note  Patient: Megan Crawford  Procedure(s) Performed: CATARACT EXTRACTION PHACO AND INTRAOCULAR LENS PLACEMENT (IOC) RIGHT 10.94 01:06.4 (Right: Eye)     Patient location during evaluation: PACU Anesthesia Type: MAC Level of consciousness: awake and alert Pain management: pain level controlled Vital Signs Assessment: post-procedure vital signs reviewed and stable Respiratory status: spontaneous breathing, nonlabored ventilation, respiratory function stable and patient connected to nasal cannula oxygen Cardiovascular status: stable and blood pressure returned to baseline Postop Assessment: no apparent nausea or vomiting Anesthetic complications: no   No notable events documented.  Trecia Rogers

## 2021-04-12 ENCOUNTER — Encounter: Payer: Self-pay | Admitting: Ophthalmology

## 2021-04-25 ENCOUNTER — Ambulatory Visit: Payer: Medicare Other | Admitting: Anesthesiology

## 2021-04-25 ENCOUNTER — Encounter: Payer: Self-pay | Admitting: Ophthalmology

## 2021-04-25 ENCOUNTER — Encounter
Admission: RE | Disposition: A | Discharge status: Home / Self Care | Payer: Self-pay | Source: Home / Self Care | Attending: Ophthalmology

## 2021-04-25 ENCOUNTER — Ambulatory Visit
Admission: RE | Admit: 2021-04-25 | Discharge: 2021-04-25 | Disposition: A | Discharge status: Home / Self Care | Payer: Medicare Other | Attending: Ophthalmology | Admitting: Ophthalmology

## 2021-04-25 ENCOUNTER — Other Ambulatory Visit: Payer: Self-pay

## 2021-04-25 DIAGNOSIS — H2512 Age-related nuclear cataract, left eye: Secondary | ICD-10-CM | POA: Diagnosis not present

## 2021-04-25 DIAGNOSIS — F17210 Nicotine dependence, cigarettes, uncomplicated: Secondary | ICD-10-CM | POA: Diagnosis not present

## 2021-04-25 DIAGNOSIS — J449 Chronic obstructive pulmonary disease, unspecified: Secondary | ICD-10-CM | POA: Insufficient documentation

## 2021-04-25 HISTORY — PX: CATARACT EXTRACTION W/PHACO: SHX586

## 2021-04-25 SURGERY — PHACOEMULSIFICATION, CATARACT, WITH IOL INSERTION
Anesthesia: Monitor Anesthesia Care | Site: Eye | Laterality: Left

## 2021-04-25 MED ORDER — MOXIFLOXACIN HCL 0.5 % OP SOLN
OPHTHALMIC | Status: DC | PRN
  Administered 2021-04-25: 0.2 mL via OPHTHALMIC

## 2021-04-25 MED ORDER — ARMC OPHTHALMIC DILATING DROPS
1.0000 "application " | OPHTHALMIC | Status: DC | PRN
  Administered 2021-04-25 (×3): 1 via OPHTHALMIC

## 2021-04-25 MED ORDER — ACETAMINOPHEN 160 MG/5ML PO SOLN: 325.0000 mg | Freq: Once | ORAL | Status: DC

## 2021-04-25 MED ORDER — SIGHTPATH DOSE#1 BSS IO SOLN
INTRAOCULAR | Status: DC | PRN
  Administered 2021-04-25: 1 mL

## 2021-04-25 MED ORDER — ACETAMINOPHEN 325 MG PO TABS: 325.0000 mg | ORAL_TABLET | Freq: Once | ORAL | Status: DC

## 2021-04-25 MED ORDER — TETRACAINE HCL 0.5 % OP SOLN
1.0000 [drp] | OPHTHALMIC | Status: DC | PRN
  Administered 2021-04-25 (×3): 1 [drp] via OPHTHALMIC

## 2021-04-25 MED ORDER — SIGHTPATH DOSE#1 BSS IO SOLN
INTRAOCULAR | Status: DC | PRN
  Administered 2021-04-25: 15 mL

## 2021-04-25 MED ORDER — SIGHTPATH DOSE#1 BSS IO SOLN
INTRAOCULAR | Status: DC | PRN
  Administered 2021-04-25: 49 mL via OPHTHALMIC

## 2021-04-25 MED ORDER — FENTANYL CITRATE (PF) 100 MCG/2ML IJ SOLN
INTRAMUSCULAR | Status: DC | PRN
  Administered 2021-04-25: 50 ug via INTRAVENOUS

## 2021-04-25 MED ORDER — LACTATED RINGERS IV SOLN: INTRAVENOUS | Status: DC

## 2021-04-25 MED ORDER — BRIMONIDINE TARTRATE-TIMOLOL 0.2-0.5 % OP SOLN
OPHTHALMIC | Status: DC | PRN
  Administered 2021-04-25: 1 [drp] via OPHTHALMIC

## 2021-04-25 MED ORDER — SIGHTPATH DOSE#1 NA CHONDROIT SULF-NA HYALURON 40-17 MG/ML IO SOLN
INTRAOCULAR | Status: DC | PRN
Start: 2021-04-25 — End: 2021-04-25
  Administered 2021-04-25: 1 mL via INTRAOCULAR

## 2021-04-25 MED ORDER — MIDAZOLAM HCL 2 MG/2ML IJ SOLN
INTRAMUSCULAR | Status: DC | PRN
Start: 2021-04-25 — End: 2021-04-25
  Administered 2021-04-25: 1 mg via INTRAVENOUS

## 2021-04-25 SURGICAL SUPPLY — 16 items
CANNULA ANT/CHMB 27G (MISCELLANEOUS) IMPLANT
CANNULA ANT/CHMB 27GA (MISCELLANEOUS) IMPLANT
CATARACT SUITE SIGHTPATH (MISCELLANEOUS) ×2 IMPLANT
FEE CATARACT SUITE SIGHTPATH (MISCELLANEOUS) ×1 IMPLANT
GLOVE SURG ENC TEXT LTX SZ8 (GLOVE) ×2 IMPLANT
GLOVE SURG TRIUMPH 8.0 PF LTX (GLOVE) ×2 IMPLANT
LENS IOL TECNIS EYHANCE 20.0 (Intraocular Lens) ×1 IMPLANT
NDL FILTER BLUNT 18X1 1/2 (NEEDLE) ×1 IMPLANT
NEEDLE FILTER BLUNT 18X 1/2SAF (NEEDLE) ×1
NEEDLE FILTER BLUNT 18X1 1/2 (NEEDLE) ×1 IMPLANT
PACK VIT ANT 23G (MISCELLANEOUS) IMPLANT
RING MALYGIN (MISCELLANEOUS) IMPLANT
SUT ETHILON 10-0 CS-B-6CS-B-6 (SUTURE)
SUTURE EHLN 10-0 CS-B-6CS-B-6 (SUTURE) IMPLANT
SYR 3ML LL SCALE MARK (SYRINGE) ×2 IMPLANT
WATER STERILE IRR 250ML POUR (IV SOLUTION) ×2 IMPLANT

## 2021-04-25 NOTE — H&P (Signed)
[place]  ? ?Primary Care Physician:  Bo Merino, FNP ?Ophthalmologist: Dr. Benay Pillow ? ?Pre-Procedure History & Physical: ?HPI:  Megan Crawford is a 71 y.o. female here for cataract surgery. ?  ?Past Medical History:  ?Diagnosis Date  ? Cardiac murmur   ? Fatigue   ? Tobacco use   ? ? ?Past Surgical History:  ?Procedure Laterality Date  ? CATARACT EXTRACTION W/PHACO Right 04/11/2021  ? Procedure: CATARACT EXTRACTION PHACO AND INTRAOCULAR LENS PLACEMENT (IOC) RIGHT 10.94 01:06.4;  Surgeon: Birder Robson, MD;  Location: [place];  Service: Ophthalmology;  Laterality: Right;  ? COLONOSCOPY WITH PROPOFOL N/A 06/25/2017  ? Procedure: COLONOSCOPY WITH PROPOFOL;  Surgeon: [name], MD;  Location: [facility] ENDOSCOPY;  Service: Endoscopy;  Laterality: N/A;  ? HERNIA REPAIR  1970's  ? ? ?Prior to Admission medications   ?Not on File  ? ? ?Allergies as of 03/03/2021  ? (No Known Allergies)  ? ? ?Family History  ?Problem Relation Age of Onset  ? Alzheimer's disease Mother   ? Heart attack Father   ? Cancer Father   ?     ? prostate cancer  ? Healthy Sister   ? Diabetes Brother   ? Cancer Brother   ?     colon and lung  ? Healthy Son   ? Healthy Sister   ? Healthy Sister   ? Breast cancer Neg Hx   ? ? ?Social History  ? ?Socioeconomic History  ? Marital status: Divorced  ?  Spouse name: Not on file  ? Number of children: 2  ? Years of education: Not on file  ? Highest education level: 12th grade  ?Occupational History  ? Occupation: Retired  ?Tobacco Use  ? Smoking status: Every Day  ?  Packs/day: 2.00  ?  Years: 52.00  ?  Pack years: 104.00  ?  Types: Cigarettes  ? Smokeless tobacco: Never  ? Tobacco comments:  ?  pt declines information about quitting  ?Vaping Use  ? Vaping Use: Never used  ?Substance and Sexual Activity  ? Alcohol use: No  ? Drug use: No  ? Sexual activity: Not Currently  ?  Partners: Male  ?Other Topics Concern  ? Not on file  ?Social History Narrative  ? Not on file   ? ?Social Determinants of Health  ? ?Financial Resource Strain: Low Risk   ? Difficulty of Paying Living Expenses: Not hard at all  ?Food Insecurity: No Food Insecurity  ? Worried About Charity fundraiser in the Last Year: Never true  ? Ran Out of Food in the Last Year: Never true  ?Transportation Needs: No Transportation Needs  ? Lack of Transportation (Medical): No  ? Lack of Transportation (Non-Medical): No  ?Physical Activity: Insufficiently Active  ? Days of Exercise per Week: 3 days  ? Minutes of Exercise per Session: 30 min  ?Stress: No Stress Concern Present  ? Feeling of Stress : Not at all  ?Social Connections: Moderately Isolated  ? Frequency of Communication with Friends and Family: Twice a week  ? Frequency of Social Gatherings with Friends and Family: Twice a week  ? Attends Religious Services: More than 4 times per year  ? Active Member of Clubs or Organizations: No  ? Attends Archivist Meetings: Never  ? Marital Status: Divorced  ?Intimate Partner Violence: Not At Risk  ? Fear of Current or Ex-Partner: No  ? Emotionally Abused: No  ? Physically Abused: No  ?  Sexually Abused: No  ? ? ?Review of Systems: ?See HPI, otherwise negative ROS ? ?Physical Exam: ?BP (!) 155/57   Pulse 65   Temp (!) 97.2 ?F (36.2 ?C) (Temporal)   Resp 16   Ht 5' (1.524 m)   Wt 77.1 kg   LMP  (LMP Unknown)   SpO2 97%   BMI 33.20 kg/m?  ?General:   Alert, cooperative in NAD ?Head:  Normocephalic and atraumatic. ?Respiratory:  Normal work of breathing. ?Cardiovascular:  RRR ? ?Impression/Plan: ?Megan Crawford is here for cataract surgery. ? ?Risks, benefits, limitations, and alternatives regarding cataract surgery have been reviewed with the patient.  Questions have been answered.  All parties agreeable. ? ? ?Birder Robson, MD  04/25/2021, 7:16 AM ? ? ?

## 2021-04-25 NOTE — Op Note (Signed)
PREOPERATIVE DIAGNOSIS:  Nuclear sclerotic cataract of the left eye. ?  ?POSTOPERATIVE DIAGNOSIS:  Nuclear sclerotic cataract of the left eye. ?  ?OPERATIVE PROCEDURE:ORPROCALL@ ?  ?SURGEON:  Birder Robson, MD. ?  ?ANESTHESIA: ? ?Anesthesiologist: Ronelle Nigh, MD ?CRNA: Dionne Bucy, CRNA ? ?1.      Managed anesthesia care. ?2.     0.17m of Shugarcaine was instilled following the paracentesis ?  ?COMPLICATIONS:  None. ?  ?TECHNIQUE:   Stop and chop ?  ?DESCRIPTION OF PROCEDURE:  The patient was examined and consented in the preoperative holding area where the aforementioned topical anesthesia was applied to the left eye and then brought back to the Operating Room where the left eye was prepped and draped in the usual sterile ophthalmic fashion and a lid speculum was placed. A paracentesis was created with the side port blade and the anterior chamber was filled with viscoelastic. A near clear corneal incision was performed with the steel keratome. A continuous curvilinear capsulorrhexis was performed with a cystotome followed by the capsulorrhexis forceps. Hydrodissection and hydrodelineation were carried out with BSS on a blunt cannula. The lens was removed in a stop and chop  technique and the remaining cortical material was removed with the irrigation-aspiration handpiece. The capsular bag was inflated with viscoelastic and the Technis [id number] lens was placed in the capsular bag without complication. The remaining viscoelastic was removed from the eye with the irrigation-aspiration handpiece. The wounds were hydrated. The anterior chamber was flushed with BSS and the eye was inflated to physiologic pressure. 0.137mVigamox was placed in the anterior chamber. The wounds were found to be water tight. The eye was dressed with Combigan. The patient was given protective glasses to wear throughout the day and a shield with which to sleep tonight. The patient was also given drops with which to begin a drop regimen  today and will follow-up with me in one day. ?Implant Name Type Inv. Item Serial No. Manufacturer Lot No. LRB No. Used Action  ?LENS IOL TECNIS EYHANCE 20.0 - [id number] Lens LENS IOL TECNIS EYHANCE 20.0 [id number]  Left 1 Implanted  ?  ?Procedure(s) with comments: ?CATARACT EXTRACTION PHACO AND INTRAOCULAR LENS PLACEMENT (IOC) LEFT (Left) - 6.88 ?0:40.0 ? ?Electronically signed: WiBirder Robson/14/2023 7:48 AM ? ?

## 2021-04-25 NOTE — Anesthesia Procedure Notes (Signed)
Procedure Name: [place] ?Date/Time: 04/25/2021 7:28 AM ?Performed by: Dionne Bucy, CRNA ?Pre-anesthesia Checklist: Patient identified, Emergency Drugs available, Suction available, Patient being monitored and Timeout performed ?Patient Re-evaluated:Patient Re-evaluated prior to induction ?Oxygen Delivery Method: Nasal cannula ?Placement Confirmation: positive ETCO2 ? ? ? ? ?

## 2021-04-25 NOTE — Anesthesia Postprocedure Evaluation (Signed)
Anesthesia Post Note ? ?Patient: Megan Crawford ? ?Procedure(s) Performed: CATARACT EXTRACTION PHACO AND INTRAOCULAR LENS PLACEMENT (IOC) LEFT (Left: Eye) ? ? ?  ?Patient location during evaluation: PACU ?Anesthesia Type: MAC ?Level of consciousness: awake and alert and oriented ?Pain management: satisfactory to patient ?Vital Signs Assessment: post-procedure vital signs reviewed and stable ?Respiratory status: spontaneous breathing, nonlabored ventilation and respiratory function stable ?Cardiovascular status: blood pressure returned to baseline and stable ?Postop Assessment: Adequate PO intake and No signs of nausea or vomiting ?Anesthetic complications: no ? ? ?No notable events documented. ? ?Raliegh Ip ? ? ? ? ? ?

## 2021-04-25 NOTE — Transfer of Care (Signed)
Immediate Anesthesia Transfer of Care Note ? ?Patient: Megan Crawford ? ?Procedure(s) Performed: CATARACT EXTRACTION PHACO AND INTRAOCULAR LENS PLACEMENT (IOC) LEFT (Left: Eye) ? ?Patient Location: PACU ? ?Anesthesia Type: MAC ? ?Level of Consciousness: awake, alert  and patient cooperative ? ?Airway and Oxygen Therapy: Patient Spontanous Breathing and Patient connected to supplemental oxygen ? ?Post-op Assessment: Post-op Vital signs reviewed, Patient's Cardiovascular Status Stable, Respiratory Function Stable, Patent Airway and No signs of Nausea or vomiting ? ?Post-op Vital Signs: Reviewed and stable ? ?Complications: No notable events documented. ? ?

## 2021-04-25 NOTE — Anesthesia Preprocedure Evaluation (Signed)
Anesthesia Evaluation  ?Patient identified by MRN, date of birth, ID band ?Patient awake ? ? ? ?Reviewed: ?Allergy & Precautions, H&P , NPO status , Patient's Chart, lab work & pertinent test results ? ?Airway ?Mallampati: II ? ?TM Distance: >3 FB ?Neck ROM: full ? ? ? Dental ?no notable dental hx. ? ?  ?Pulmonary ?COPD, Current SmokerPatient did not abstain from smoking.,  ?  ?Pulmonary exam normal ?breath sounds clear to auscultation ? ? ? ? ? ? Cardiovascular ?Normal cardiovascular exam ?Rhythm:regular Rate:Normal ? ? ?  ?Neuro/Psych ?  ? GI/Hepatic ?  ?Endo/Other  ? ? Renal/GU ?  ? ?  ?Musculoskeletal ? ? Abdominal ?  ?Peds ? Hematology ?  ?Anesthesia Other Findings ? ? Reproductive/Obstetrics ? ?  ? ? ? ? ? ? ? ? ? ? ? ? ? ?  ?  ? ? ? ? ? ? ? ? ?Anesthesia Physical ?Anesthesia Plan ? ?ASA: 2 ? ?Anesthesia Plan: MAC  ? ?Post-op Pain Management: Minimal or no pain anticipated  ? ?Induction:  ? ?PONV Risk Score and Plan: 2 and Treatment may vary due to age or medical condition, TIVA and Midazolam ? ?Airway Management Planned:  ? ?Additional Equipment:  ? ?Intra-op Plan:  ? ?Post-operative Plan:  ? ?Informed Consent: I have reviewed the patients History and Physical, chart, labs and discussed the procedure including the risks, benefits and alternatives for the proposed anesthesia with the patient or authorized representative who has indicated his/her understanding and acceptance.  ? ? ? ?Dental Advisory Given ? ?Plan Discussed with: CRNA ? ?Anesthesia Plan Comments:   ? ? ? ? ? ? ?Anesthesia Quick Evaluation ? ?

## 2021-04-25 NOTE — Discharge Instructions (Signed)

## 2021-04-26 ENCOUNTER — Encounter: Payer: Self-pay | Admitting: Ophthalmology

## 2021-11-15 ENCOUNTER — Ambulatory Visit (LOCAL_COMMUNITY_HEALTH_CENTER): Payer: Medicare Other

## 2021-11-15 DIAGNOSIS — Z23 Encounter for immunization: Secondary | ICD-10-CM | POA: Diagnosis not present

## 2021-11-15 DIAGNOSIS — Z719 Counseling, unspecified: Secondary | ICD-10-CM

## 2021-11-15 NOTE — Progress Notes (Signed)
Patient requesting COVID-19 vaccine and Flu Vaccine today.  VIS for Flu and Comirnaty Patient information sheet provided.  Patient with no questions.  Covid-19 Screening Question: Are you feeling sick today? No  Have you ever received a dose of COVID-19 Vaccine? [employer], [place], [place], [place], Other) Yes  If yes, which vaccine and how many doses?    4 dose of Pfizer  Did you bring the vaccination record card or other documentation?  Yes   Do you have a health condition or are undergoing treatment that makes you moderately or severely immunocompromised? This would include, but not be limited to: cancer, HIV, organ transplant, immunosuppressive therapy/high-dose corticosteroids, or moderate/severe primary immunodeficiency.  NO  Have you received COVID-19 vaccine before or during hematopoietic cell transplant (HCT) or CAR-T-cell therapies? No  Have you ever had an allergic reaction to: (This would include a severe allergic reaction or a reaction that caused hives, swelling, or respiratory distress, including wheezing.) A component of a COVID-19 vaccine or a previous dose of COVID-19 vaccine? No   Have you ever had an allergic reaction to another vaccine (other thanCOVID-19 vaccine) or an injectable medication? (This would include a severe allergic reaction or a reaction that caused hives, swelling, or respiratory distress, including wheezing.)   No    Do you have a history of any of the following:  Myocarditis or Pericarditis No  Dermal fillers:  No  Multisystem Inflammatory Syndrome (MIS-C or MIS-A)? No  COVID-19 disease within the past 3 months? No  Vaccinated with monkeypox vaccine in the last 4 weeks? No    Vaccine administered and tolerated well.  After vaccine care reviewed. Copy of NCIR given and copy of COVID-19 care provided.    Zac Torti Shelda Pal, RN

## 2021-12-26 ENCOUNTER — Ambulatory Visit: Payer: Medicare Other

## 2021-12-26 ENCOUNTER — Ambulatory Visit (INDEPENDENT_AMBULATORY_CARE_PROVIDER_SITE_OTHER): Payer: Medicare Other

## 2021-12-26 VITALS — BP 122/70 | HR 87 | Temp 98.5°F | Resp 16 | Ht 61.0 in | Wt 169.3 lb

## 2021-12-26 DIAGNOSIS — Z1231 Encounter for screening mammogram for malignant neoplasm of breast: Secondary | ICD-10-CM

## 2021-12-26 DIAGNOSIS — Z0000 Encounter for general adult medical examination without abnormal findings: Secondary | ICD-10-CM

## 2021-12-26 NOTE — Progress Notes (Signed)
Subjective:   Megan Crawford is a 71 y.o. female who presents for Medicare Annual (Subsequent) preventive examination.  Review of Systems    Defer to PCP Cardiac Risk Factors include: advanced age (>69mn, >>51women)     Objective:    Today's Vitals   12/26/21 0822 12/26/21 0824  BP: 122/70   Pulse: 87   Resp: 16   Temp: 98.5 F (36.9 C)   TempSrc: Oral   SpO2: 94%   Weight: 169 lb 4.8 oz (76.8 kg)   Height: '5\' 1"'$  (1.549 m)   PainSc: 0-No pain 0-No pain   Body mass index is 31.99 kg/m.     12/26/2021    8:33 AM 04/25/2021    6:40 AM 12/22/2020    8:19 AM 12/22/2019    8:21 AM 09/12/2018    1:40 PM 06/25/2017    9:48 AM 06/04/2017    3:05 PM  Advanced Directives  Does Patient Have a Medical Advance Directive? No No No No No No No  Would patient like information on creating a medical advance directive? No - Patient declined No - Patient declined No - Patient declined No - Patient declined No - Patient declined  Yes (MAU/Ambulatory/Procedural Areas - Information given)    Current Medications (verified) No outpatient encounter medications on file as of 12/26/2021.   No facility-administered encounter medications on file as of 12/26/2021.    Allergies (verified) Patient has no known allergies.   History: Past Medical History:  Diagnosis Date   Cardiac murmur    Fatigue    Tobacco use    Past Surgical History:  Procedure Laterality Date   CATARACT EXTRACTION W/PHACO Right 04/11/2021   Procedure: CATARACT EXTRACTION PHACO AND INTRAOCULAR LENS PLACEMENT (IOC) RIGHT 10.94 01:06.4;  Surgeon: PBirder Robson MD;  [place] [facility]  Service: Ophthalmology;  Laterality: Right;   CATARACT EXTRACTION W/PHACO Left 04/25/2021   Procedure: CATARACT EXTRACTION PHACO AND INTRAOCULAR LENS PLACEMENT ([place];  Surgeon: PBirder Robson MD;  [facility] [facility]  Service: Ophthalmology;  Laterality: Left;  6.88 0:40.0   COLONOSCOPY WITH PROPOFOL N/A  06/25/2017   Procedure: COLONOSCOPY WITH PROPOFOL;  Surgeon: WLucilla Lame MD;  [facility]  Service: Endoscopy;  Laterality: N/A;   HERNIA REPAIR  1970's   Family History  Problem Relation Age of Onset   Alzheimer's disease Mother    Heart attack Father    Cancer Father        ? prostate cancer   Healthy Sister    Diabetes Brother    Cancer Brother        colon and lung   Healthy Son    Healthy Sister    Healthy Sister    Breast cancer Neg Hx    Social History   Socioeconomic History   Marital status: Divorced    Spouse name: Not on file   Number of children: 2   Years of education: Not on file   Highest education level: 12th grade  Occupational History   Occupation: Retired  Tobacco Use   Smoking status: Every Day    Packs/day: 2.00    Years: 52.00    Total pack years: 104.00    Types: Cigarettes   Smokeless tobacco: Never   Tobacco comments:    pt declines information about quitting  Vaping Use   Vaping Use: Never used  Substance and Sexual Activity   Alcohol use: No   Drug use: No   Sexual activity: Not  Currently    Partners: Male  Other Topics Concern   Not on file  Social History Narrative   Not on file   Social Determinants of Health   Financial Resource Strain: Low Risk  (12/26/2021)   Overall Financial Resource Strain (CARDIA)    Difficulty of Paying Living Expenses: Not hard at all  Food Insecurity: No Food Insecurity (12/26/2021)   Hunger Vital Sign    Worried About Running Out of Food in the Last Year: Never true    Ran Out of Food in the Last Year: Never true  Transportation Needs: No Transportation Needs (12/26/2021)   PRAPARE - Hydrologist (Medical): No    Lack of Transportation (Non-Medical): No  Physical Activity: Insufficiently Active (12/26/2021)   Exercise Vital Sign    Days of Exercise per Week: 3 days    Minutes of Exercise per Session: 30 min  Stress: No Stress Concern Present  (12/26/2021)   [place]    Feeling of Stress : Not at all  Social Connections: Moderately Isolated (12/26/2021)   Social Connection and Isolation Panel [NHANES]    Frequency of Communication with Friends and Family: Once a week    Frequency of Social Gatherings with Friends and Family: Once a week    Attends Religious Services: More than 4 times per year    Active Member of [employer] or Organizations: No    Attends Music therapist: More than 4 times per year    Marital Status: Divorced    Tobacco Counseling Ready to quit: Not Answered Counseling given: Not Answered Tobacco comments: pt declines information about quitting   Clinical Intake:  Pre-visit preparation completed: No  Pain : No/denies pain Pain Score: 0-No pain     BMI - recorded: 31.99 Nutritional Status: BMI > 30  Obese Nutritional Risks: None Diabetes: No  How often do you need to have someone help you when you read instructions, pamphlets, or other written materials from your doctor or pharmacy?: 1 - Never What is the last grade level you completed in school?: 12  Diabetic?no  Interpreter Needed?: No  Information entered by :: Rexford Maus, [place]   Activities of Daily Living    12/26/2021    8:33 AM 04/25/2021    6:45 AM  In your present state of health, do you have any difficulty performing the following activities:  Hearing? 0 0  Vision? 0 0  Difficulty concentrating or making decisions? 0 0  Walking or climbing stairs? 0 0  Dressing or bathing? 0 0  Doing errands, shopping? 0   Preparing Food and eating ? N   Using the Toilet? N   In the past six months, have you accidently leaked urine? N   Do you have problems with loss of bowel control? N   Managing your Medications? N   Managing your Finances? N   Housekeeping or managing your Housekeeping? N     Patient Care Team: Bo Merino, FNP as PCP - General  (Nurse Practitioner)  Indicate any recent Medical Services you may have received from other than [facility] providers in the past year (date may be approximate).     Assessment:   This is a routine wellness examination for Megan Crawford.  Hearing/Vision screen No results found.  Dietary issues and exercise activities discussed: Current Exercise Habits: Home exercise routine, Type of exercise: walking, Time (Minutes): 30, Frequency (Times/Week): 3, Weekly Exercise (Minutes/Week): 90,  Intensity: Moderate, Exercise limited by: None identified   Goals Addressed   None   Depression Screen    12/26/2021    8:26 AM 01/19/2021    8:34 AM 12/22/2020    8:18 AM 01/19/2020    9:04 AM 12/22/2019    8:21 AM 11/18/2019    2:05 PM 09/12/2018    1:13 PM  PHQ 2/9 Scores  PHQ - 2 Score 0 0 0 0 0 0 0  PHQ- 9 Score  0     1    Fall Risk    12/26/2021    8:33 AM 01/19/2021    8:33 AM 12/22/2020    8:20 AM 01/19/2020    9:03 AM 12/22/2019    8:22 AM  Fall Risk   Falls in the past year? 0 0 0 0 0  Number falls in past yr:  0 0 0 0  Injury with Fall?  0 0 0 0  Risk for fall due to : No Fall Risks  No Fall Risks  No Fall Risks  Follow up Falls prevention discussed;Education provided;Falls evaluation completed  Falls prevention discussed Falls evaluation completed Falls prevention discussed    FALL RISK PREVENTION PERTAINING TO THE HOME:  Any stairs in or around the home? Yes  If so, are there any without handrails? No  Home free of loose throw rugs in walkways, pet beds, electrical cords, etc? Yes  Adequate lighting in your home to reduce risk of falls? Yes   ASSISTIVE DEVICES UTILIZED TO PREVENT FALLS:  Life alert? No  Use of a cane, walker or w/c? No  Grab bars in the bathroom? Yes  Shower chair or bench in shower? No  Elevated toilet seat or a handicapped toilet? Yes   TIMED UP AND GO:  Was the test performed? Yes .  Length of time to ambulate 10 feet: 10 sec.   Gait steady and fast without  use of assistive device  Cognitive Function:        12/26/2021    8:34 AM 12/22/2019    8:23 AM 09/12/2018    1:27 PM 06/04/2017    3:09 PM  6CIT Screen  What Year? 0 points 0 points 0 points 0 points  What month? 0 points 0 points 0 points 0 points  What time? 0 points 0 points 0 points 0 points  Count back from 20 0 points 0 points 0 points 0 points  Months in reverse 0 points 0 points 0 points 0 points  Repeat phrase 0 points 0 points 0 points 0 points  Total Score 0 points 0 points 0 points 0 points    Immunizations Immunization History  Administered Date(s) Administered   Fluad Quad(high Dose 65+) 10/28/2018, 11/18/2019   Influenza, High Dose Seasonal PF 12/15/2016, 12/07/2017, 11/30/2020, 11/15/2021   Influenza-Unspecified 11/28/2016   PFIZER Comirnaty(Gray Top)Covid-19 Tri-Sucrose Vaccine 11/15/2021   PFIZER(Purple Top)SARS-COV-2 Vaccination 03/09/2019, 03/30/2019, 12/03/2019   Pneumococcal Conjugate-13 06/04/2017   Pneumococcal Polysaccharide-23 10/28/2018    TDAP status: Up to date  Flu Vaccine status: Up to date  Pneumococcal vaccine status: Up to date  Covid-19 vaccine status: Completed vaccines  Qualifies for Shingles Vaccine? No   Zostavax completed No   Shingrix Completed?: Yes  Screening Tests Health Maintenance  Topic Date Due   Lung Cancer Screening  12/02/2020   MAMMOGRAM  02/24/2021   Zoster Vaccines- Shingrix (1 of 2) 03/28/2022 (Originally 03/10/1969)   Hepatitis C Screening  12/27/2022 (Originally 03/09/1968)   COVID-19 Vaccine (  5 - Pfizer risk series) 01/10/2022   DEXA SCAN  02/24/2022   COLONOSCOPY (Pts 45-7yr Insurance coverage will need to be confirmed)  06/26/2022   Medicare Annual Wellness (AWV)  12/27/2022   Pneumonia Vaccine 71 Years old  Completed   INFLUENZA VACCINE  Completed   HPV VACCINES  Aged Out   TETANUS/TDAP  Discontinued    Health Maintenance  Health Maintenance Due  Topic Date Due   Lung Cancer Screening   12/02/2020   MAMMOGRAM  02/24/2021    Colorectal cancer screening: No longer required.   Mammogram status: Ordered 12/26/2021. Pt provided with contact info and advised to call to schedule appt.   Bone Density status: Completed 02/25/2020. Results reflect: Bone density results: OSTEOPENIA. Repeat every 2 years.  Lung Cancer Screening: (Low Dose CT Chest recommended if Age 73-80years, 30 pack-year currently smoking OR have quit w/in 15years.) does qualify.   Lung Cancer Screening Referral: 12/26/2021  Additional Screening:  Hepatitis C Screening: does not qualify; Completed N/A  Vision Screening: Recommended annual ophthalmology exams for early detection of glaucoma and other disorders of the eye. Is the patient up to date with their annual eye exam?  Yes  Who is the provider or what is the name of the office in which the patient attends annual eye exams? [facility] is not established with a provider, would they like to be referred to a provider to establish care? No .   Dental Screening: Recommended annual dental exams for proper oral hygiene  Community Resource Referral / Chronic Care Management: CRR required this visit?  No   CCM required this visit?  No      Plan:     I have personally reviewed and noted the following in the patient's chart:   Medical and social history Use of alcohol, tobacco or illicit drugs  Current medications and supplements including opioid prescriptions. Patient is not currently taking opioid prescriptions. Functional ability and status Nutritional status Physical activity Advanced directives List of other physicians Hospitalizations, surgeries, and ER visits in previous 12 months Vitals Screenings to include cognitive, depression, and falls Referrals and appointments  In addition, I have reviewed and discussed with patient certain preventive protocols, quality metrics, and best practice recommendations. A written  personalized care plan for preventive services as well as general preventive health recommendations were provided to patient.     SRoyal Hawthorn [place]  12/26/2021   Nurse Notes:  Ms. HBenton, Thank you for taking time to come for your Medicare Wellness Visit. I appreciate your ongoing commitment to your health goals. Please review the following plan we discussed and let me know if I can assist you in the future.   These are the goals we discussed:  Goals      DIET - INCREASE WATER INTAKE     Recommend to drink at least 6-8 8oz glasses of water per day.     Quit Smoking     If you wish to quit smoking, help is available. For free tobacco cessation program offerings call the [facility]at (367-290-1367or Live Well Line at (667-870-1891 You may also visit [email or web address] or email [email or web address] for more information on other programs.          This is a list of the screening recommended for you and due dates:  Health Maintenance  Topic Date Due   Screening for Lung Cancer  12/02/2020   Mammogram  02/24/2021  Zoster (Shingles) Vaccine (1 of 2) 03/28/2022*   Hepatitis C Screening: USPSTF Recommendation to screen - Ages 18-79 yo.  12/27/2022*   COVID-19 Vaccine (5 - Pfizer risk series) 01/10/2022   DEXA scan (bone density measurement)  02/24/2022   Colon Cancer Screening  06/26/2022   Medicare Annual Wellness Visit  12/27/2022   Pneumonia Vaccine  Completed   Flu Shot  Completed   HPV Vaccine  Aged Out   Tetanus Vaccine  Discontinued  *Topic was postponed. The date shown is not the original due date.

## 2021-12-27 ENCOUNTER — Ambulatory Visit: Payer: Medicare Other | Admitting: Nurse Practitioner

## 2022-05-03 ENCOUNTER — Other Ambulatory Visit: Payer: Self-pay | Admitting: Emergency Medicine

## 2022-05-03 ENCOUNTER — Telehealth: Payer: Self-pay | Admitting: Nurse Practitioner

## 2022-05-03 DIAGNOSIS — Z1211 Encounter for screening for malignant neoplasm of colon: Secondary | ICD-10-CM

## 2022-05-03 NOTE — Telephone Encounter (Signed)
Pt said she is now ready to get a referral for her to do a colonscopy. Want it to just be in [place]

## 2022-05-03 NOTE — Telephone Encounter (Signed)
Referral ordered

## 2022-05-04 ENCOUNTER — Telehealth: Payer: Self-pay

## 2022-05-04 DIAGNOSIS — Z86010 Personal history of colonic polyps: Secondary | ICD-10-CM

## 2022-05-04 MED ORDER — NA SULFATE-K SULFATE-MG SULF 17.5-3.13-1.6 GM/177ML PO SOLN: 1.0000 | Freq: Once | ORAL | 0 refills | Status: AC

## 2022-05-04 NOTE — Addendum Note (Signed)
Addended by: Vanetta Mulders on: 05/04/2022 11:13 AM   Modules accepted: Orders

## 2022-05-04 NOTE — Telephone Encounter (Signed)
Pt came to office to check the status of colonoscopy would like call back

## 2022-05-04 NOTE — Telephone Encounter (Signed)
Gastroenterology Pre-Procedure Review  Request Date: 06/26/22 Requesting Physician: Dr. Allen Norris  PATIENT REVIEW QUESTIONS: The patient responded to the following health history questions as indicated:    1. Are you having any GI issues? no 2. Do you have a personal history of Polyps? yes (history of colon polyps) 3. Do you have a family history of Colon Cancer or Polyps? no 4. Diabetes Mellitus? no 5. Joint replacements in the past 12 months?no 6. Major health problems in the past 3 months?no 7. Any artificial heart valves, MVP, or defibrillator?no    MEDICATIONS & ALLERGIES:    Patient reports the following regarding taking any anticoagulation/antiplatelet therapy:   Plavix, Coumadin, Eliquis, Xarelto, Lovenox, Pradaxa, Brilinta, or Effient? no Aspirin? no  Patient confirms/reports the following medications:  No current outpatient medications on file.   No current facility-administered medications for this visit.    Patient confirms/reports the following allergies:  No Known Allergies  No orders of the defined types were placed in this encounter.   AUTHORIZATION INFORMATION Primary Insurance: 1D#: Group #:  Secondary Insurance: 1D#: Group #:  SCHEDULE INFORMATION: Date: 06/26/2022 Time: Location: [place]

## 2022-05-07 ENCOUNTER — Other Ambulatory Visit: Payer: Self-pay | Admitting: Nurse Practitioner

## 2022-05-07 DIAGNOSIS — Z1231 Encounter for screening mammogram for malignant neoplasm of breast: Secondary | ICD-10-CM

## 2022-05-08 ENCOUNTER — Telehealth: Payer: Self-pay | Admitting: Nurse Practitioner

## 2022-05-08 NOTE — Telephone Encounter (Signed)
Pt is due for yearly chest ct scan. Please advise.

## 2022-05-11 ENCOUNTER — Other Ambulatory Visit: Payer: Self-pay | Admitting: Nurse Practitioner

## 2022-05-11 DIAGNOSIS — Z720 Tobacco use: Secondary | ICD-10-CM

## 2022-05-11 NOTE — Telephone Encounter (Signed)
Need yearly lung ct ordered

## 2022-05-24 ENCOUNTER — Ambulatory Visit
Admission: RE | Admit: 2022-05-24 | Discharge: 2022-05-24 | Disposition: A | Discharge status: Home / Self Care | Payer: Medicare Other | Source: Ambulatory Visit | Attending: Nurse Practitioner | Admitting: Nurse Practitioner

## 2022-05-24 DIAGNOSIS — Z1231 Encounter for screening mammogram for malignant neoplasm of breast: Secondary | ICD-10-CM | POA: Insufficient documentation

## 2022-05-25 ENCOUNTER — Other Ambulatory Visit: Payer: Self-pay | Admitting: *Deleted

## 2022-05-25 DIAGNOSIS — F17210 Nicotine dependence, cigarettes, uncomplicated: Secondary | ICD-10-CM

## 2022-05-25 DIAGNOSIS — Z87891 Personal history of nicotine dependence: Secondary | ICD-10-CM

## 2022-05-25 DIAGNOSIS — Z122 Encounter for screening for malignant neoplasm of respiratory organs: Secondary | ICD-10-CM

## 2022-06-05 ENCOUNTER — Ambulatory Visit
Admission: RE | Admit: 2022-06-05 | Discharge: 2022-06-05 | Disposition: A | Discharge status: Home / Self Care | Payer: Medicare Other | Source: Ambulatory Visit | Attending: Acute Care | Admitting: Acute Care

## 2022-06-05 DIAGNOSIS — F17210 Nicotine dependence, cigarettes, uncomplicated: Secondary | ICD-10-CM | POA: Diagnosis not present

## 2022-06-05 DIAGNOSIS — Z87891 Personal history of nicotine dependence: Secondary | ICD-10-CM | POA: Diagnosis not present

## 2022-06-05 DIAGNOSIS — Z122 Encounter for screening for malignant neoplasm of respiratory organs: Secondary | ICD-10-CM | POA: Insufficient documentation

## 2022-06-08 ENCOUNTER — Other Ambulatory Visit: Payer: Self-pay

## 2022-06-08 DIAGNOSIS — F17210 Nicotine dependence, cigarettes, uncomplicated: Secondary | ICD-10-CM

## 2022-06-08 DIAGNOSIS — Z122 Encounter for screening for malignant neoplasm of respiratory organs: Secondary | ICD-10-CM

## 2022-06-08 DIAGNOSIS — Z87891 Personal history of nicotine dependence: Secondary | ICD-10-CM

## 2022-06-25 ENCOUNTER — Other Ambulatory Visit: Payer: Self-pay

## 2022-06-25 ENCOUNTER — Telehealth: Payer: Self-pay

## 2022-06-25 DIAGNOSIS — Z86010 Personal history of colonic polyps: Secondary | ICD-10-CM

## 2022-06-25 NOTE — Telephone Encounter (Signed)
Patients colonoscopy has been ordered with Dr. Tobi Bastos on 06/26/22.  Order was not entered upon scheduling.  Apologized to patient for the scheduling error.  Asked if she would be okay with another provider performing her colonoscopy.  She agreed to have physician change.  Informed her that  Dr. Tobi Bastos will be doing her colonoscopy.  Thanks, [place], [place]

## 2022-06-26 ENCOUNTER — Ambulatory Visit: Payer: Medicare Other | Admitting: Anesthesiology

## 2022-06-26 ENCOUNTER — Encounter
Admission: RE | Disposition: A | Discharge status: Home / Self Care | Payer: Self-pay | Source: Home / Self Care | Attending: Gastroenterology

## 2022-06-26 ENCOUNTER — Ambulatory Visit
Admission: RE | Admit: 2022-06-26 | Discharge: 2022-06-26 | Disposition: A | Discharge status: Home / Self Care | Payer: Medicare Other | Attending: Gastroenterology | Admitting: Gastroenterology

## 2022-06-26 ENCOUNTER — Encounter: Payer: Self-pay | Admitting: Gastroenterology

## 2022-06-26 DIAGNOSIS — K640 First degree hemorrhoids: Secondary | ICD-10-CM | POA: Insufficient documentation

## 2022-06-26 DIAGNOSIS — F17210 Nicotine dependence, cigarettes, uncomplicated: Secondary | ICD-10-CM | POA: Insufficient documentation

## 2022-06-26 DIAGNOSIS — Z860100 Personal history of colon polyps, unspecified: Secondary | ICD-10-CM

## 2022-06-26 DIAGNOSIS — D122 Benign neoplasm of ascending colon: Secondary | ICD-10-CM | POA: Diagnosis not present

## 2022-06-26 DIAGNOSIS — D124 Benign neoplasm of descending colon: Secondary | ICD-10-CM | POA: Diagnosis not present

## 2022-06-26 DIAGNOSIS — Z800 Family history of malignant neoplasm of digestive organs: Secondary | ICD-10-CM | POA: Diagnosis not present

## 2022-06-26 DIAGNOSIS — K635 Polyp of colon: Secondary | ICD-10-CM | POA: Diagnosis not present

## 2022-06-26 DIAGNOSIS — Z1211 Encounter for screening for malignant neoplasm of colon: Secondary | ICD-10-CM | POA: Diagnosis not present

## 2022-06-26 DIAGNOSIS — D126 Benign neoplasm of colon, unspecified: Secondary | ICD-10-CM | POA: Diagnosis not present

## 2022-06-26 HISTORY — PX: COLONOSCOPY WITH PROPOFOL: SHX5780

## 2022-06-26 SURGERY — COLONOSCOPY WITH PROPOFOL
Anesthesia: General

## 2022-06-26 MED ORDER — PROPOFOL 500 MG/50ML IV EMUL
INTRAVENOUS | Status: DC | PRN
  Administered 2022-06-26: 155 ug/kg/min via INTRAVENOUS

## 2022-06-26 MED ORDER — PROPOFOL 10 MG/ML IV BOLUS
INTRAVENOUS | Status: DC | PRN
  Administered 2022-06-26: 60 mg via INTRAVENOUS
  Administered 2022-06-26: 20 mg via INTRAVENOUS

## 2022-06-26 MED ORDER — SODIUM CHLORIDE 0.9 % IV SOLN: INTRAVENOUS | Status: DC

## 2022-06-26 MED ORDER — LIDOCAINE HCL (CARDIAC) PF 100 MG/5ML IV SOSY
PREFILLED_SYRINGE | INTRAVENOUS | Status: DC | PRN
  Administered 2022-06-26: 100 mg via INTRAVENOUS

## 2022-06-26 NOTE — Op Note (Signed)
[facility] Gastroenterology Patient Name: Megan Crawford Procedure Date: 06/26/2022 9:34 AM MRN: [id number] Account #: [id number] Date of Birth: [birth year] Admit Type: Outpatient Age: 72 Room: [facility] ENDO ROOM 2 Gender: Female Note Status: Finalized Instrument Name: Prentice Docker [id number] Procedure:             Colonoscopy Indications:           Surveillance: Personal history of colonic polyps                         (unknown histology) on last colonoscopy 5 years ago Providers:             Wyline Mood MD, MD Medicines:             Monitored Anesthesia Care Complications:         No immediate complications. Procedure:             Pre-Anesthesia Assessment:                        - Prior to the procedure, a History and Physical was                         performed, and patient medications, allergies and                         sensitivities were reviewed. The patient's tolerance                         of previous anesthesia was reviewed.                        - The risks and benefits of the procedure and the                         sedation options and risks were discussed with the                         patient. All questions were answered and informed                         consent was obtained.                        - ASA Grade Assessment: II - A patient with mild                         systemic disease.                        After obtaining informed consent, the colonoscope was                         passed under direct vision. Throughout the procedure,                         the patient's blood pressure, pulse, and oxygen                         saturations were monitored continuously. The  Colonoscope was introduced through the anus and                         advanced to the the cecum, identified by the                         appendiceal orifice. The colonoscopy was performed                         with ease. The patient tolerated  the procedure well.                         The quality of the bowel preparation was excellent.                         The ileocecal valve, appendiceal orifice, and rectum                         were photographed. Findings:      The perianal and digital rectal examinations were normal.      A 5 mm polyp was found in the ascending colon. The polyp was sessile.       The polyp was removed with a jumbo cold forceps. Resection and retrieval       were complete.      A 5 mm polyp was found in the descending colon. The polyp was sessile.       The polyp was removed with a cold snare. Resection and retrieval were       complete.      Non-bleeding internal hemorrhoids were found during retroflexion. The       hemorrhoids were medium-sized and Grade I (internal hemorrhoids that do       not prolapse).      The exam was otherwise without abnormality on direct and retroflexion       views. Impression:            - One 5 mm polyp in the ascending colon, removed with                         a jumbo cold forceps. Resected and retrieved.                        - One 5 mm polyp in the descending colon, removed with                         a cold snare. Resected and retrieved.                        - Non-bleeding internal hemorrhoids.                        - The examination was otherwise normal on direct and                         retroflexion views. Recommendation:        - Discharge patient to home (with escort).                        - Resume previous diet.                        -  Continue present medications.                        - Await pathology results.                        - Repeat colonoscopy for surveillance based on                         pathology results. Procedure Code(s):     --- Professional ---                        [phone], Colonoscopy, flexible; with removal of                         tumor(s), polyp(s), or other lesion(s) by snare                         technique                         45380, 59, Colonoscopy, flexible; with biopsy, single                         or multiple Diagnosis Code(s):     --- Professional ---                        Z86.010, Personal history of colonic polyps                        D12.2, Benign neoplasm of ascending colon                        D12.4, Benign neoplasm of descending colon                        K64.0, First degree hemorrhoids CPT copyright 2022 American Medical Association. All rights reserved. The codes documented in this report are preliminary and upon coder review may  be revised to meet current compliance requirements. Wyline Mood, MD Wyline Mood MD, MD 06/26/2022 10:08:04 AM This report has been signed electronically. Number of Addenda: 0 Note Initiated On: 06/26/2022 9:34 AM Scope Withdrawal Time: 0 hours 11 minutes 1 second  Total Procedure Duration: 0 hours 16 minutes 54 seconds  Estimated Blood Loss:  Estimated blood loss: none.      [facility]

## 2022-06-26 NOTE — H&P (Signed)
Wyline Mood, MD [address], [address], [place], [place], [address] [address], [place], [place], [address] Phone: [phone]  Fax: [phone]  Primary Care Physician:  Berniece Salines, FNP   Pre-Procedure History & Physical: HPI:  Megan Crawford is a 72 y.o. female is here for an colonoscopy.   Past Medical History:  Diagnosis Date   Cardiac murmur    Fatigue    Tobacco use     Past Surgical History:  Procedure Laterality Date   CATARACT EXTRACTION W/PHACO Right 04/11/2021   Procedure: CATARACT EXTRACTION PHACO AND INTRAOCULAR LENS PLACEMENT (IOC) RIGHT 10.94 01:06.4;  Surgeon: Galen Manila, MD;  Location: [facility];  Service: Ophthalmology;  Laterality: Right;   CATARACT EXTRACTION W/PHACO Left 04/25/2021   Procedure: CATARACT EXTRACTION PHACO AND INTRAOCULAR LENS PLACEMENT (IOC) LEFT;  Surgeon: Galen Manila, MD;  Location: [facility];  Service: Ophthalmology;  Laterality: Left;  6.88 0:40.0   COLONOSCOPY WITH PROPOFOL N/A 06/25/2017   Procedure: COLONOSCOPY WITH PROPOFOL;  Surgeon: Midge Minium, MD;  Location: [facility];  Service: Endoscopy;  Laterality: N/A;   HERNIA REPAIR  1970's    Prior to Admission medications   Not on File    Allergies as of 06/25/2022   (No Known Allergies)    Family History  Problem Relation Age of Onset   Alzheimer's disease Mother    Heart attack Father    Cancer Father        ? prostate cancer   Healthy Sister    Diabetes Brother    Cancer Brother        colon and lung   Healthy Son    Healthy Sister    Healthy Sister    Breast cancer Neg Hx     Social History   Socioeconomic History   Marital status: Divorced    Spouse name: Not on file   Number of children: 2   Years of education: Not on file   Highest education level: 12th grade  Occupational History   Occupation: Retired  Tobacco Use   Smoking status: Every Day    Packs/day: 2.00    Years: 52.00    Additional pack  years: 0.00    Total pack years: 104.00    Types: Cigarettes   Smokeless tobacco: Never   Tobacco comments:    pt declines information about quitting  Vaping Use   Vaping Use: Never used  Substance and Sexual Activity   Alcohol use: No   Drug use: No   Sexual activity: Not Currently    Partners: Male  Other Topics Concern   Not on file  Social History Narrative   Not on file   Social Determinants of Health   Financial Resource Strain: Low Risk  (12/26/2021)   Overall Financial Resource Strain (CARDIA)    Difficulty of Paying Living Expenses: Not hard at all  Food Insecurity: No Food Insecurity (12/26/2021)   Hunger Vital Sign    Worried About Running Out of Food in the Last Year: Never true    Ran Out of Food in the Last Year: Never true  Transportation Needs: No Transportation Needs (12/26/2021)   PRAPARE - Administrator, Civil Service (Medical): No    Lack of Transportation (Non-Medical): No  Physical Activity: Insufficiently Active (12/26/2021)   Exercise Vital Sign    Days of Exercise per Week: 3 days    Minutes of Exercise per Session: 30 min  Stress: No Stress Concern  Present (12/26/2021)   [employer] of Occupational Health - Occupational Stress Questionnaire    Feeling of Stress : Not at all  Social Connections: Moderately Isolated (12/26/2021)   Social Connection and Isolation Panel [NHANES]    Frequency of Communication with Friends and Family: Once a week    Frequency of Social Gatherings with Friends and Family: Once a week    Attends Religious Services: More than 4 times per year    Active Member of [employer] or Organizations: No    Attends Engineer, structural: More than 4 times per year    Marital Status: Divorced  Catering manager Violence: Not At Risk (12/26/2021)   Humiliation, Afraid, Rape, and Kick questionnaire    Fear of Current or Ex-Partner: No    Emotionally Abused: No    Physically Abused: No    Sexually Abused: No     Review of Systems: See HPI, otherwise negative ROS  Physical Exam: BP (!) 139/50   Pulse 65   Temp (!) 96.6 F (35.9 C) (Temporal)   Resp 16   Ht 5' (1.524 m)   Wt 76.2 kg   LMP  (LMP Unknown)   SpO2 100%   BMI 32.81 kg/m  General:   Alert,  pleasant and cooperative in NAD Head:  Normocephalic and atraumatic. Neck:  Supple; no masses or thyromegaly. Lungs:  Clear throughout to auscultation, normal respiratory effort.    Heart:  +S1, +S2, Regular rate and rhythm, No edema. Abdomen:  Soft, nontender and nondistended. Normal bowel sounds, without guarding, and without rebound.   Neurologic:  Alert and  oriented x4;  grossly normal neurologically.  Impression/Plan: Megan Crawford is here for an colonoscopy to be performed for surveillance due to prior history of colon polyps   Risks, benefits, limitations, and alternatives regarding  colonoscopy have been reviewed with the patient.  Questions have been answered.  All parties agreeable.   Wyline Mood, MD  06/26/2022, 9:20 AM

## 2022-06-26 NOTE — Transfer of Care (Signed)
Immediate Anesthesia Transfer of Care Note  Patient: Megan Crawford  Procedure(s) Performed: COLONOSCOPY WITH PROPOFOL  Patient Location: Endoscopy Unit  Anesthesia Type:General  Level of Consciousness: drowsy and patient cooperative  Airway & Oxygen Therapy: Patient Spontanous Breathing and Patient connected to face mask oxygen  Post-op Assessment: Report given to RN and Post -op Vital signs reviewed and stable  Post vital signs: Reviewed and stable  Last Vitals:  Vitals Value Taken Time  BP 109/53 06/26/22 1010  Temp    Pulse 61 06/26/22 1013  Resp 21 06/26/22 1013  SpO2 100 % 06/26/22 1013  Vitals shown include unvalidated device data.  Last Pain:  Vitals:   06/26/22 1010  TempSrc:   PainSc: Asleep         Complications: No notable events documented.

## 2022-06-26 NOTE — Anesthesia Preprocedure Evaluation (Signed)
Anesthesia Evaluation  Patient identified by MRN, date of birth, ID band Patient awake    Reviewed: Allergy & Precautions, H&P , NPO status , Patient's Chart, lab work & pertinent test results, reviewed documented beta blocker date and time   History of Anesthesia Complications Negative for: history of anesthetic complications  Airway Mallampati: I  TM Distance: >3 FB Neck ROM: full    Dental  (+) Caps, Dental Advidsory Given   Pulmonary neg shortness of breath, neg COPD, neg recent URI, Current Smoker          Cardiovascular Exercise Tolerance: Good (-) hypertension(-) angina (-) CAD, (-) Past MI, (-) Cardiac Stents and (-) CABG (-) dysrhythmias + Valvular Problems/Murmurs      Neuro/Psych negative neurological ROS  negative psych ROS   GI/Hepatic negative GI ROS, Neg liver ROS,,,  Endo/Other  negative endocrine ROS    Renal/GU negative Renal ROS  negative genitourinary   Musculoskeletal   Abdominal   Peds  Hematology negative hematology ROS (+)   Anesthesia Other Findings Past Medical History: No date: Cardiac murmur No date: Fatigue No date: Tobacco use   Reproductive/Obstetrics negative OB ROS                             Anesthesia Physical Anesthesia Plan  ASA: 2  Anesthesia Plan: General   Post-op Pain Management:    Induction: Intravenous  PONV Risk Score and Plan: 2 and Propofol infusion and TIVA  Airway Management Planned: Nasal Cannula and Natural Airway  Additional Equipment:   Intra-op Plan:   Post-operative Plan:   Informed Consent: I have reviewed the patients History and Physical, chart, labs and discussed the procedure including the risks, benefits and alternatives for the proposed anesthesia with the patient or authorized representative who has indicated his/her understanding and acceptance.     Dental Advisory Given  Plan Discussed with:  Anesthesiologist, CRNA and Surgeon  Anesthesia Plan Comments:         Anesthesia Quick Evaluation

## 2022-06-26 NOTE — Anesthesia Postprocedure Evaluation (Signed)
Anesthesia Post Note  Patient: Megan Crawford  Procedure(s) Performed: COLONOSCOPY WITH PROPOFOL  Patient location during evaluation: Endoscopy Anesthesia Type: General Level of consciousness: awake and alert Pain management: pain level controlled Vital Signs Assessment: post-procedure vital signs reviewed and stable Respiratory status: spontaneous breathing, nonlabored ventilation, respiratory function stable and patient connected to nasal cannula oxygen Cardiovascular status: blood pressure returned to baseline and stable Postop Assessment: no apparent nausea or vomiting Anesthetic complications: no   No notable events documented.   Last Vitals:  Vitals:   06/26/22 1020 06/26/22 1030  BP: (!) 105/49 (!) 110/49  Pulse: 63 73  Resp: (!) 22 19  Temp:    SpO2: 100% 98%    Last Pain:  Vitals:   06/26/22 1030  TempSrc:   PainSc: 0-No pain                 Lenard Simmer

## 2022-06-26 NOTE — Anesthesia Procedure Notes (Signed)
Procedure Name: General with mask airway Date/Time: 06/26/2022 9:51 AM  Performed by: Mohammed Kindle, CRNAPre-anesthesia Checklist: Patient identified, Emergency Drugs available, Suction available and Patient being monitored Patient Re-evaluated:Patient Re-evaluated prior to induction Oxygen Delivery Method: Simple face mask Induction Type: IV induction Placement Confirmation: positive ETCO2, CO2 detector and breath sounds checked- equal and bilateral Dental Injury: Teeth and Oropharynx as per pre-operative assessment

## 2022-06-27 ENCOUNTER — Encounter: Payer: Self-pay | Admitting: Gastroenterology

## 2022-06-27 LAB — SURGICAL PATHOLOGY

## 2022-12-10 DIAGNOSIS — H5015 Alternating exotropia: Secondary | ICD-10-CM | POA: Diagnosis not present

## 2022-12-10 DIAGNOSIS — H43813 Vitreous degeneration, bilateral: Secondary | ICD-10-CM | POA: Diagnosis not present

## 2023-01-01 ENCOUNTER — Ambulatory Visit: Payer: Medicare Other

## 2023-01-24 ENCOUNTER — Ambulatory Visit: Payer: Medicare Other

## 2023-01-24 DIAGNOSIS — Z0000 Encounter for general adult medical examination without abnormal findings: Secondary | ICD-10-CM

## 2023-01-24 DIAGNOSIS — Z780 Asymptomatic menopausal state: Secondary | ICD-10-CM | POA: Diagnosis not present

## 2023-01-24 NOTE — Patient Instructions (Addendum)
Megan Crawford , Thank you for taking time to come for your Medicare Wellness Visit. I appreciate your ongoing commitment to your health goals. Please review the following plan we discussed and let me know if I can assist you in the future.   Referrals/Orders/Follow-Ups/Clinician Recommendations: BONE DENSITY SCAN REFERRAL SENT  This is a list of the screening recommended for you and due dates:  Health Maintenance  Topic Date Due   Hepatitis C Screening  Never done   DTaP/Tdap/Td vaccine (1 - Tdap) Never done   Zoster (Shingles) Vaccine (1 of 2) Never done   DEXA scan (bone density measurement)  02/24/2022   Flu Shot  09/13/2022   COVID-19 Vaccine (5 - 2024-25 season) 10/14/2022   Mammogram  05/24/2023   Screening for Lung Cancer  06/05/2023   Medicare Annual Wellness Visit  01/24/2024   Colon Cancer Screening  06/26/2027   Pneumonia Vaccine  Completed   HPV Vaccine  Aged Out    Advanced directives: (ACP Link)Information on Advanced Care Planning can be found at [facility] Advance Health Care Directives Advance Health Care Directives ([email or web address]   Next Medicare Annual Wellness Visit scheduled for next year: Yes   01/30/24 @ 3:50 PM IN PERSON   You have an order for:  []   2D Mammogram  []   3D Mammogram  [x]   Bone Density     Please call for appointment:  [facility]  [address] [place] [address] [phone]      Make sure to wear two-piece clothing.  No lotions, powders, or deodorants the day of the appointment. Make sure to bring picture ID and insurance card.  Bring list of medications you are currently taking including any supplements.   Schedule your [facility] screening mammogram through MyChart!   Log into your MyChart account.  Go to 'Visit' (or 'Appointments' if on mobile App) --> Schedule an Appointment  Under 'Select a Reason for Visit' choose the Mammogram Screening  option.  Complete the pre-visit questions and select the time and place that best fits your schedule.

## 2023-01-24 NOTE — Progress Notes (Signed)
Subjective:   Megan Crawford is a 72 y.o. female who presents for Medicare Annual (Subsequent) preventive examination.  Visit Complete: Virtual I connected with  Maricela Curet on 01/24/23 by a audio enabled telemedicine application and verified that I am speaking with the correct person using two identifiers.  Patient Location: Home  Provider Location: Office/Clinic  I discussed the limitations of evaluation and management by telemedicine. The patient expressed understanding and agreed to proceed.  Vital Signs: Because this visit was a virtual/telehealth visit, some criteria may be missing or patient reported. Any vitals not documented were not able to be obtained and vitals that have been documented are patient reported.  Cardiac Risk Factors include: advanced age (>27men, >70 women);dyslipidemia;obesity (BMI >30kg/m2);sedentary lifestyle     Objective:    There were no vitals filed for this visit. There is no height or weight on file to calculate BMI.     01/24/2023    3:13 PM 12/26/2021    8:33 AM 04/25/2021    6:40 AM 12/22/2020    8:19 AM 12/22/2019    8:21 AM 09/12/2018    1:40 PM 06/25/2017    9:48 AM  Advanced Directives  Does Patient Have a Medical Advance Directive? No No No No No No No  Would patient like information on creating a medical advance directive? No - Patient declined No - Patient declined No - Patient declined No - Patient declined No - Patient declined No - Patient declined     Current Medications (verified) No outpatient encounter medications on file as of 01/24/2023.   No facility-administered encounter medications on file as of 01/24/2023.    Allergies (verified) Patient has no known allergies.   History: Past Medical History:  Diagnosis Date   Cardiac murmur    Fatigue    Tobacco use    Past Surgical History:  Procedure Laterality Date   CATARACT EXTRACTION W/PHACO Right 04/11/2021   Procedure: CATARACT EXTRACTION PHACO AND INTRAOCULAR  LENS PLACEMENT (IOC) RIGHT 10.94 01:06.4;  Surgeon: Galen Manila, MD;  Location: [facility];  Service: Ophthalmology;  Laterality: Right;   CATARACT EXTRACTION W/PHACO Left 04/25/2021   Procedure: CATARACT EXTRACTION PHACO AND INTRAOCULAR LENS PLACEMENT (IOC) LEFT;  Surgeon: Galen Manila, MD;  Location: [facility];  Service: Ophthalmology;  Laterality: Left;  6.88 0:40.0   COLONOSCOPY WITH PROPOFOL N/A 06/25/2017   Procedure: COLONOSCOPY WITH PROPOFOL;  Surgeon: Midge Minium, MD;  Location: [facility];  Service: Endoscopy;  Laterality: N/A;   COLONOSCOPY WITH PROPOFOL N/A 06/26/2022   Procedure: COLONOSCOPY WITH PROPOFOL;  Surgeon: Wyline Mood, MD;  Location: [facility] ENDOSCOPY;  Service: Gastroenterology;  Laterality: N/A;   HERNIA REPAIR  1970's   Family History  Problem Relation Age of Onset   Alzheimer's disease Mother    Heart attack Father    Cancer Father        ? prostate cancer   Healthy Sister    Diabetes Brother    Cancer Brother        colon and lung   Healthy Son    Healthy Sister    Healthy Sister    Breast cancer Neg Hx    Social History   Socioeconomic History   Marital status: Divorced    Spouse name: Not on file   Number of children: 2   Years of education: Not on file   Highest education level: 12th grade  Occupational History   Occupation: Retired  Tobacco Use   Smoking status:  Every Day    Current packs/day: 2.00    Average packs/day: 2.0 packs/day for 52.0 years (104.0 ttl pk-yrs)    Types: Cigarettes   Smokeless tobacco: Never   Tobacco comments:    pt declines information about quitting  Vaping Use   Vaping status: Never Used  Substance and Sexual Activity   Alcohol use: No   Drug use: No   Sexual activity: Not Currently    Partners: Male  Other Topics Concern   Not on file  Social History Narrative   Not on file   Social Drivers of Health   Financial Resource Strain: Low Risk  (01/24/2023)   Overall Financial  Resource Strain (CARDIA)    Difficulty of Paying Living Expenses: Not hard at all  Food Insecurity: No Food Insecurity (01/24/2023)   Hunger Vital Sign    Worried About Running Out of Food in the Last Year: Never true    Ran Out of Food in the Last Year: Never true  Transportation Needs: No Transportation Needs (01/24/2023)   PRAPARE - Administrator, Civil Service (Medical): No    Lack of Transportation (Non-Medical): No  Physical Activity: Insufficiently Active (01/24/2023)   Exercise Vital Sign    Days of Exercise per Week: 3 days    Minutes of Exercise per Session: 30 min  Stress: No Stress Concern Present (01/24/2023)   [employer] of Occupational Health - Occupational Stress Questionnaire    Feeling of Stress : Not at all  Social Connections: Moderately Isolated (01/24/2023)   Social Connection and Isolation Panel [NHANES]    Frequency of Communication with Friends and Family: Three times a week    Frequency of Social Gatherings with Friends and Family: Once a week    Attends Religious Services: More than 4 times per year    Active Member of [employer] or Organizations: No    Attends Engineer, structural: Never    Marital Status: Divorced    Tobacco Counseling Ready to quit: Not Answered Counseling given: Not Answered Tobacco comments: pt declines information about quitting   Clinical Intake:  Pre-visit preparation completed: Yes  Pain : No/denies pain     BMI - recorded: 31.9 Nutritional Status: BMI > 30  Obese Nutritional Risks: None Diabetes: No  How often do you need to have someone help you when you read instructions, pamphlets, or other written materials from your doctor or pharmacy?: 1 - Never  Interpreter Needed?: No  Information entered by :: Kennedy Bucker, LPN   Activities of Daily Living    01/24/2023    3:15 PM  In your present state of health, do you have any difficulty performing the following activities:  Hearing? 0   Vision? 0  Difficulty concentrating or making decisions? 0  Walking or climbing stairs? 0  Dressing or bathing? 0  Doing errands, shopping? 0  Preparing Food and eating ? N  Using the Toilet? N  In the past six months, have you accidently leaked urine? N  Do you have problems with loss of bowel control? N  Managing your Medications? N  Managing your Finances? N  Housekeeping or managing your Housekeeping? N    Patient Care Team: Berniece Salines, FNP as PCP - General (Nurse Practitioner) Galen Manila, MD as Referring Physician (Ophthalmology)  Indicate any recent Medical Services you may have received from other than [facility] providers in the past year (date may be approximate).     Assessment:   This is  a routine wellness examination for Megan Crawford.  Hearing/Vision screen Hearing Screening - Comments:: No aids Vision Screening - Comments:: Readers, driving- had cataract sgy- Dr.Porfilio   Goals Addressed             This Visit's Progress    DIET - EAT MORE FRUITS AND VEGETABLES         Depression Screen    01/24/2023    3:10 PM 12/26/2021    8:26 AM 01/19/2021    8:34 AM 12/22/2020    8:18 AM 01/19/2020    9:04 AM 12/22/2019    8:21 AM 11/18/2019    2:05 PM  PHQ 2/9 Scores  PHQ - 2 Score 0 0 0 0 0 0 0  PHQ- 9 Score 0  0        Fall Risk    01/24/2023    3:15 PM 12/26/2021    8:33 AM 01/19/2021    8:33 AM 12/22/2020    8:20 AM 01/19/2020    9:03 AM  Fall Risk   Falls in the past year? 0 0 0 0 0  Number falls in past yr: 0  0 0 0  Injury with Fall? 0  0 0 0  Risk for fall due to : No Fall Risks No Fall Risks  No Fall Risks   Follow up Falls prevention discussed;Falls evaluation completed Falls prevention discussed;Education provided;Falls evaluation completed  Falls prevention discussed Falls evaluation completed    MEDICARE RISK AT HOME: Medicare Risk at Home Any stairs in or around the home?: Yes If so, are there any without handrails?: No Home free  of loose throw rugs in walkways, pet beds, electrical cords, etc?: Yes Adequate lighting in your home to reduce risk of falls?: Yes Life alert?: No Use of a cane, walker or w/c?: No Grab bars in the bathroom?: No Shower chair or bench in shower?: No Elevated toilet seat or a handicapped toilet?: No  TIMED UP AND GO:  Was the test performed?  No    Cognitive Function:        01/24/2023    3:16 PM 12/26/2021    8:34 AM 12/22/2019    8:23 AM 09/12/2018    1:27 PM 06/04/2017    3:09 PM  6CIT Screen  What Year? 0 points 0 points 0 points 0 points 0 points  What month? 0 points 0 points 0 points 0 points 0 points  What time? 0 points 0 points 0 points 0 points 0 points  Count back from 20 0 points 0 points 0 points 0 points 0 points  Months in reverse 0 points 0 points 0 points 0 points 0 points  Repeat phrase 0 points 0 points 0 points 0 points 0 points  Total Score 0 points 0 points 0 points 0 points 0 points    Immunizations Immunization History  Administered Date(s) Administered   Fluad Quad(high Dose 65+) 10/28/2018, 11/18/2019   Influenza, High Dose Seasonal PF 12/15/2016, 12/07/2017, 11/30/2020, 11/15/2021   Influenza-Unspecified 11/28/2016   PFIZER Comirnaty(Gray Top)Covid-19 Tri-Sucrose Vaccine 11/15/2021   PFIZER(Purple Top)SARS-COV-2 Vaccination 03/09/2019, 03/30/2019, 12/03/2019   Pneumococcal Conjugate-13 06/04/2017   Pneumococcal Polysaccharide-23 10/28/2018    TDAP status: Due, Education has been provided regarding the importance of this vaccine. Advised may receive this vaccine at local pharmacy or Health Dept. Aware to provide a copy of the vaccination record if obtained from local pharmacy or Health Dept. Verbalized acceptance and understanding.  Flu Vaccine status: Declined, Education has been provided  regarding the importance of this vaccine but patient still declined. Advised may receive this vaccine at local pharmacy or Health Dept. Aware to provide a copy  of the vaccination record if obtained from local pharmacy or Health Dept. Verbalized acceptance and understanding.  Pneumococcal vaccine status: Up to date  Covid-19 vaccine status: Completed vaccines  Qualifies for Shingles Vaccine? Yes   Zostavax completed No   Shingrix Completed?: No.    Education has been provided regarding the importance of this vaccine. Patient has been advised to call insurance company to determine out of pocket expense if they have not yet received this vaccine. Advised may also receive vaccine at local pharmacy or Health Dept. Verbalized acceptance and understanding.  Screening Tests Health Maintenance  Topic Date Due   Hepatitis C Screening  Never done   DTaP/Tdap/Td (1 - Tdap) Never done   Zoster Vaccines- Shingrix (1 of 2) Never done   DEXA SCAN  02/24/2022   COVID-19 Vaccine (5 - 2024-25 season) 10/14/2022   INFLUENZA VACCINE  05/13/2023 (Originally 09/13/2022)   MAMMOGRAM  05/24/2023   Lung Cancer Screening  06/05/2023   Medicare Annual Wellness (AWV)  01/24/2024   Colonoscopy  06/26/2027   Pneumonia Vaccine 47+ Years old  Completed   HPV VACCINES  Aged Out    Health Maintenance  Health Maintenance Due  Topic Date Due   Hepatitis C Screening  Never done   DTaP/Tdap/Td (1 - Tdap) Never done   Zoster Vaccines- Shingrix (1 of 2) Never done   DEXA SCAN  02/24/2022   COVID-19 Vaccine (5 - 2024-25 season) 10/14/2022    Colorectal cancer screening: Type of screening: Colonoscopy. Completed 06/26/22. Repeat every 5 years- will age out  Mammogram status: Completed 05/24/22. Repeat every year  Bone Density status: Completed 02/25/20. Results reflect: Bone density results: OSTEOPOROSIS. Repeat every 2 years.- REFERRAL SENT TODAY  Lung Cancer Screening: (Low Dose CT Chest recommended if Age 12-80 years, 20 pack-year currently smoking OR have quit w/in 15years.) does qualify.   Lung Cancer Screening Referral: CT SCAN 06/08/22  Additional  Screening:  Hepatitis C Screening: does qualify; Completed NO  Vision Screening: Recommended annual ophthalmology exams for early detection of glaucoma and other disorders of the eye. Is the patient up to date with their annual eye exam?  Yes  Who is the provider or what is the name of the office in which the patient attends annual eye exams? DR.PORFILIO If pt is not established with a provider, would they like to be referred to a provider to establish care? No .   Dental Screening: Recommended annual dental exams for proper oral hygiene    Community Resource Referral / Chronic Care Management: CRR required this visit?  No   CCM required this visit?  No     Plan:     I have personally reviewed and noted the following in the patient's chart:   Medical and social history Use of alcohol, tobacco or illicit drugs  Current medications and supplements including opioid prescriptions. Patient is not currently taking opioid prescriptions. Functional ability and status Nutritional status Physical activity Advanced directives List of other physicians Hospitalizations, surgeries, and ER visits in previous 12 months Vitals Screenings to include cognitive, depression, and falls Referrals and appointments  In addition, I have reviewed and discussed with patient certain preventive protocols, quality metrics, and best practice recommendations. A written personalized care plan for preventive services as well as general preventive health recommendations were provided to patient.  Hal Hope, LPN   [id number]   After Visit Summary: (MyChart) Due to this being a telephonic visit, the after visit summary with patients personalized plan was offered to patient via MyChart   Nurse Notes: NONE

## 2023-01-30 ENCOUNTER — Ambulatory Visit: Payer: Medicare Other

## 2023-01-30 DIAGNOSIS — Z719 Counseling, unspecified: Secondary | ICD-10-CM

## 2023-01-30 DIAGNOSIS — Z23 Encounter for immunization: Secondary | ICD-10-CM | POA: Diagnosis not present

## 2023-01-30 NOTE — Progress Notes (Signed)
In nurse clinic today for immunizations. Walk in. Received RSV/Covid/HighDose Flu. VIS statements given . Met criteria for RSV vaccine. observation for Covid-no adverse effects noted.NCIR updated and copy given .

## 2023-06-10 ENCOUNTER — Other Ambulatory Visit: Payer: Self-pay | Admitting: Acute Care

## 2023-06-10 DIAGNOSIS — Z87891 Personal history of nicotine dependence: Secondary | ICD-10-CM

## 2023-06-10 DIAGNOSIS — Z122 Encounter for screening for malignant neoplasm of respiratory organs: Secondary | ICD-10-CM

## 2023-06-10 DIAGNOSIS — F17210 Nicotine dependence, cigarettes, uncomplicated: Secondary | ICD-10-CM

## 2023-07-15 ENCOUNTER — Encounter: Payer: Self-pay | Admitting: Acute Care

## 2023-11-27 ENCOUNTER — Ambulatory Visit (LOCAL_COMMUNITY_HEALTH_CENTER)

## 2023-11-27 DIAGNOSIS — Z23 Encounter for immunization: Secondary | ICD-10-CM | POA: Diagnosis not present

## 2023-11-27 DIAGNOSIS — Z719 Counseling, unspecified: Secondary | ICD-10-CM

## 2023-11-27 NOTE — Progress Notes (Signed)
 Patient seen in nurse clinic for vaccinations.  COVID/Pfizer and Flu HD given and tolerated well. VIS provided.  NCIR updated and provided.  Discussed return dates for vaccine shots.  Patient waited 10 minutes and left.

## 2024-01-30 ENCOUNTER — Ambulatory Visit: Payer: Self-pay

## 2024-01-30 DIAGNOSIS — Z0000 Encounter for general adult medical examination without abnormal findings: Secondary | ICD-10-CM

## 2024-01-30 DIAGNOSIS — Z780 Asymptomatic menopausal state: Secondary | ICD-10-CM | POA: Diagnosis not present

## 2024-01-30 DIAGNOSIS — Z1231 Encounter for screening mammogram for malignant neoplasm of breast: Secondary | ICD-10-CM

## 2024-01-30 DIAGNOSIS — F17200 Nicotine dependence, unspecified, uncomplicated: Secondary | ICD-10-CM

## 2024-01-30 NOTE — Patient Instructions (Addendum)
 Megan Crawford,  Thank you for taking the time for your Medicare Wellness Visit. I appreciate your continued commitment to your health goals. Please review the care plan we discussed, and feel free to reach out if I can assist you further.  Please note that Annual Wellness Visits do not include a physical exam. Some assessments may be limited, especially if the visit was conducted virtually. If needed, we may recommend an in-person follow-up with your provider.  Ongoing Care Seeing your primary care provider every 3 to 6 months helps us  monitor your health and provide consistent, personalized care.   Referrals If a referral was made during today's visit and you haven't received any updates within two weeks, please contact the referred provider directly to check on the status.  REFERRALS SENT FOR MAMMOGRAM & BONE DENSITY SCAN  REFERRALS SENT FOR LUNG CA CT SCAN (YOU WILL RECEIVE A CALL TO SCHEDULE THIS)  You have an order for:  []   2D Mammogram  [x]   3D Mammogram  [x]   Bone Density     Please call for appointment:  [facility]  [address] [place] [address] [phone] [facility] [address] [place], [place] [address] [phone] [facility] [address] [place], [place] [address] [phone]   Make sure to wear two-piece clothing.  No lotions, powders, or deodorants the day of the appointment. Make sure to bring picture ID and insurance card.  Bring list of medications you are currently taking including any supplements.   Schedule your [place] screening mammogram through MyChart!   Log into your MyChart account.  Go to 'Visit' (or 'Appointments' if on mobile App) --> Schedule an Appointment  Under 'Select a Reason for Visit' choose the Mammogram Screening option.  Complete the pre-visit questions and select the time and place that  best fits your schedule.   Recommended Screenings:  Health Maintenance  Topic Date Due   Hepatitis C Screening  Never done   DTaP/Tdap/Td vaccine (1 - Tdap) Never done   Zoster (Shingles) Vaccine (1 of 2) Never done   Osteoporosis screening with Bone Density Scan  02/25/2023   Breast Cancer Screening  05/24/2023   Screening for Lung Cancer  06/05/2023   COVID-19 Vaccine (8 - Pfizer risk 2025-26 season) 05/27/2024   Medicare Annual Wellness Visit  01/29/2025   Colon Cancer Screening  06/26/2027   Pneumococcal Vaccine for age over 58  Completed   Flu Shot  Completed   Meningitis B Vaccine  Aged Out     Vision: Annual vision screenings are recommended for early detection of glaucoma, cataracts, and diabetic retinopathy. These exams can also reveal signs of chronic conditions such as diabetes and high blood pressure.  Dental: Annual dental screenings help detect early signs of oral cancer, gum disease, and other conditions linked to overall health, including heart disease and diabetes.  Please see the attached documents for additional preventive care recommendations.    NEXT AWV 02/18/25 @ 1:00 PM IN PERSON

## 2024-01-30 NOTE — Progress Notes (Signed)
 Chief Complaint  Patient presents with   Medicare Wellness     Subjective:   Megan Crawford is a 73 y.o. female who presents for a Medicare Annual Wellness Visit.  Visit info / Clinical Intake: Medicare Wellness Visit Type:: Subsequent Annual Wellness Visit Persons participating in visit and providing information:: patient Medicare Wellness Visit Mode:: Telephone If telephone:: video declined Since this visit was completed virtually, some vitals may be partially provided or unavailable. Missing vitals are due to the limitations of the virtual format.: Unable to obtain vitals - no equipment If Telephone or Video please confirm:: I connected with patient using audio/video enable telemedicine. I verified patient identity with two identifiers, discussed telehealth limitations, and patient agreed to proceed. Patient Location:: home Provider Location:: office Interpreter Needed?: No Pre-visit prep was completed: yes AWV questionnaire completed by patient prior to visit?: no Living arrangements:: (!) lives alone Patient's Overall Health Status Rating: good Typical amount of pain: none Does pain affect daily life?: no Are you currently prescribed opioids?: no  Dietary Habits and Nutritional Risks How many meals a day?: 2 Eats fruit and vegetables daily?: (!) no (NO FRUITS, VEGGIES ALMOST EVERY DAY) Most meals are obtained by: preparing own meals In the last 2 weeks, have you had any of the following?: none Diabetic:: no  Functional Status Activities of Daily Living (to include ambulation/medication): Independent Ambulation: Independent Medication Administration: Independent Home Management (perform basic housework or laundry): Independent Manage your own finances?: yes Primary transportation is: driving Concerns about vision?: no *vision screening is required for WTM* (WEARS GLASSES- DR.PORFILIO) Concerns about hearing?: no  Fall Screening Falls in the past year?: 0 Number of  falls in past year: 0 Was there an injury with Fall?: 0 Fall Risk Category Calculator: 0 Patient Fall Risk Level: Low Fall Risk  Fall Risk Patient at Risk for Falls Due to: No Fall Risks Fall risk Follow up: Falls evaluation completed; Falls prevention discussed  Home and Transportation Safety: All rugs have non-skid backing?: yes All stairs or steps have railings?: yes Grab bars in the bathtub or shower?: (!) no Have non-skid surface in bathtub or shower?: yes Good home lighting?: yes Regular seat belt use?: yes Hospital stays in the last year:: no  Cognitive Assessment Difficulty concentrating, remembering, or making decisions? : no Will 6CIT or Mini Cog be Completed: yes What year is it?: 0 points What month is it?: 0 points Give patient an address phrase to remember (5 components): [address] [place], [place] About what time is it?: 0 points Count backwards from 20 to 1: 0 points Say the months of the year in reverse: 0 points Repeat the address phrase from earlier: 0 points 6 CIT Score: 0 points  Advance Directives (For Healthcare) Does Patient Have a Medical Advance Directive?: No Would patient like information on creating a medical advance directive?: No - Patient declined  Reviewed/Updated  Reviewed/Updated: Reviewed All (Medical, Surgical, Family, Medications, Allergies, Care Teams, Patient Goals)    Allergies (verified) Patient has no known allergies.   Current Medications (verified) No outpatient encounter medications on file as of 01/30/2024.   No facility-administered encounter medications on file as of 01/30/2024.    History: Past Medical History:  Diagnosis Date   Cardiac murmur    Fatigue    Tobacco use    Past Surgical History:  Procedure Laterality Date   CATARACT EXTRACTION W/PHACO Right 04/11/2021   Procedure: CATARACT EXTRACTION PHACO AND INTRAOCULAR LENS PLACEMENT (IOC) RIGHT 10.94 01:06.4;  Surgeon: Jaye Fallow, MD;  Location:  [facility];  Service: Ophthalmology;  Laterality: Right;   CATARACT EXTRACTION W/PHACO Left 04/25/2021   Procedure: CATARACT EXTRACTION PHACO AND INTRAOCULAR LENS PLACEMENT (IOC) LEFT;  Surgeon: Jaye Fallow, MD;  Location: [facility];  Service: Ophthalmology;  Laterality: Left;  6.88 0:40.0   COLONOSCOPY WITH PROPOFOL  N/A 06/25/2017   Procedure: COLONOSCOPY WITH PROPOFOL ;  Surgeon: Jinny Carmine, MD;  Location: [facility];  Service: Endoscopy;  Laterality: N/A;   COLONOSCOPY WITH PROPOFOL  N/A 06/26/2022   Procedure: COLONOSCOPY WITH PROPOFOL ;  Surgeon: Therisa Bi, MD;  Location: [facility];  Service: Gastroenterology;  Laterality: N/A;   HERNIA REPAIR  1970's   Family History  Problem Relation Age of Onset   Alzheimer's disease Mother    Heart attack Father    Cancer Father        ? prostate cancer   Healthy Sister    Diabetes Brother    Cancer Brother        colon and lung   Healthy Son    Healthy Sister    Healthy Sister    Breast cancer Neg Hx    Social History   Occupational History   Occupation: Retired  Tobacco Use   Smoking status: Every Day    Current packs/day: 2.00    Average packs/day: 2.0 packs/day for 52.0 years (104.0 ttl pk-yrs)    Types: Cigarettes   Smokeless tobacco: Never   Tobacco comments:    pt declines information about quitting  Vaping Use   Vaping status: Never Used  Substance and Sexual Activity   Alcohol use: No   Drug use: No   Sexual activity: Not Currently    Partners: Male   Tobacco Counseling Ready to quit: Not Answered Counseling given: Not Answered Tobacco comments: pt declines information about quitting  SDOH Screenings   Food Insecurity: No Food Insecurity (01/30/2024)  Housing: Unknown (01/30/2024)  Transportation Needs: No Transportation Needs (01/30/2024)  Utilities: Not At Risk (01/30/2024)  Alcohol Screen: Low Risk (01/24/2023)  Depression (PHQ2-9): Low Risk (01/30/2024)  Financial Resource  Strain: Low Risk (01/24/2023)  Physical Activity: Inactive (01/30/2024)  Social Connections: Moderately Isolated (01/30/2024)  Stress: No Stress Concern Present (01/30/2024)  Tobacco Use: High Risk (01/30/2024)  Health Literacy: Adequate Health Literacy (01/30/2024)   See flowsheets for full screening details  Depression Screen PHQ 2 & 9 Depression Scale- Over the past 2 weeks, how often have you been bothered by any of the following problems? Little interest or pleasure in doing things: 0 Feeling down, depressed, or hopeless (PHQ Adolescent also includes...irritable): 0 PHQ-2 Total Score: 0 Trouble falling or staying asleep, or sleeping too much: 0 Feeling tired or having little energy: 0 Poor appetite or overeating (PHQ Adolescent also includes...weight loss): 0 Feeling bad about yourself - or that you are a failure or have let yourself or your family down: 0 Trouble concentrating on things, such as reading the newspaper or watching television (PHQ Adolescent also includes...like school work): 0 Moving or speaking so slowly that other people could have noticed. Or the opposite - being so fidgety or restless that you have been moving around a lot more than usual: 0 Thoughts that you would be better off dead, or of hurting yourself in some way: 0 PHQ-9 Total Score: 0 If you checked off any problems, how difficult have these problems made it for you to do your work, take care of things at home, or get along with other people?: Not  difficult at all  Depression Treatment Depression Interventions/Treatment : [id number] Score <4 Follow-up Not Indicated     Goals Addressed             This Visit's Progress    Cut out extra servings               Objective:    There were no vitals filed for this visit. There is no height or weight on file to calculate BMI.  Hearing/Vision screen Hearing Screening - Comments:: NO AIDS Vision Screening - Comments:: WEARS GLASSES-  DR.PORFILIO Immunizations and Health Maintenance Health Maintenance  Topic Date Due   Hepatitis C Screening  Never done   DTaP/Tdap/Td (1 - Tdap) Never done   Zoster Vaccines- Shingrix (1 of 2) Never done   Bone Density Scan  02/25/2023   Mammogram  05/24/2023   Lung Cancer Screening  06/05/2023   COVID-19 Vaccine (8 - Pfizer risk 2025-26 season) 05/27/2024   Medicare Annual Wellness (AWV)  01/29/2025   Colonoscopy  06/26/2027   Pneumococcal Vaccine: 50+ Years  Completed   Influenza Vaccine  Completed   Meningococcal B Vaccine  Aged Out        Assessment/Plan:  This is a routine wellness examination for Leyani.  Patient Care Team: Gareth Mliss FALCON, FNP as PCP - General (Nurse Practitioner) Jaye Fallow, MD as Referring Physician (Ophthalmology)  I have personally reviewed and noted the following in the patients chart:   Medical and social history Use of alcohol, tobacco or illicit drugs  Current medications and supplements including opioid prescriptions. Functional ability and status Nutritional status Physical activity Advanced directives List of other physicians Hospitalizations, surgeries, and ER visits in previous 12 months Vitals Screenings to include cognitive, depression, and falls Referrals and appointments  Orders Placed This Encounter  Procedures   MM 3D SCREENING MAMMOGRAM BILATERAL BREAST    Standing Status:   Future    Expiration Date:   01/29/2025    Reason for Exam (SYMPTOM  OR DIAGNOSIS REQUIRED):   SCREENING FOR BREAST CANCER    Preferred imaging location?:   [facility]    Release to patient:   Immediate   DG Bone Density    Standing Status:   Future    Expiration Date:   01/29/2025    Reason for Exam (SYMPTOM  OR DIAGNOSIS REQUIRED):   SCREENING FOR OSTEOPOROSIS    Preferred imaging location?:   [facility]    Release to patient:   Immediate   CT CHEST LUNG CA SCREEN LOW DOSE W/O CM    [place] [facility] [address] [address]     Standing Status:   Future    Expiration Date:   01/29/2025    Preferred Imaging Location?:   External   In addition, I have reviewed and discussed with patient certain preventive protocols, quality metrics, and best practice recommendations. A written personalized care plan for preventive services as well as general preventive health recommendations were provided to patient.   Jhonnie GORMAN Das, LPN   [id number]   Return in 1 year (on 01/29/2025).  After Visit Summary: (MyChart) Due to this being a telephonic visit, the after visit summary with patients personalized plan was offered to patient via MyChart   Nurse Notes: NEEDS TDAP, OTHER SHOTS UTD; MAMMOGRAM & BDS ORDERED; AGED OUT OF COLONOSCOPY; LUNG CT ORDERED

## 2024-02-03 ENCOUNTER — Telehealth: Payer: Self-pay

## 2024-02-03 DIAGNOSIS — Z720 Tobacco use: Secondary | ICD-10-CM

## 2024-02-03 DIAGNOSIS — F17200 Nicotine dependence, unspecified, uncomplicated: Secondary | ICD-10-CM

## 2024-02-03 NOTE — Telephone Encounter (Signed)
 New order placed.

## 2024-02-03 NOTE — Telephone Encounter (Signed)
 Copied from CRM #[id number]. Topic: Referral - Question >> Jan 31, 2024  4:02 PM Berwyn MATSU wrote: Reason for CRM: Russell from [facility] ph: [phone] called to request to know why we are referring patient to [facility] if she can do them have them done by [facility].  Per Russell she is just curious as to why. She is requesting a call back.   May you please assist.

## 2024-02-12 ENCOUNTER — Ambulatory Visit
Admission: RE | Admit: 2024-02-12 | Discharge: 2024-02-12 | Disposition: A | Discharge status: Home / Self Care | Source: Ambulatory Visit | Attending: Nurse Practitioner | Admitting: Nurse Practitioner

## 2024-02-12 DIAGNOSIS — J439 Emphysema, unspecified: Secondary | ICD-10-CM | POA: Insufficient documentation

## 2024-02-12 DIAGNOSIS — I700 Atherosclerosis of aorta: Secondary | ICD-10-CM | POA: Diagnosis not present

## 2024-02-12 DIAGNOSIS — Z720 Tobacco use: Secondary | ICD-10-CM

## 2024-02-12 DIAGNOSIS — Z122 Encounter for screening for malignant neoplasm of respiratory organs: Secondary | ICD-10-CM | POA: Diagnosis not present

## 2024-02-12 DIAGNOSIS — F17210 Nicotine dependence, cigarettes, uncomplicated: Secondary | ICD-10-CM | POA: Insufficient documentation

## 2024-02-12 DIAGNOSIS — F17200 Nicotine dependence, unspecified, uncomplicated: Secondary | ICD-10-CM

## 2024-02-24 ENCOUNTER — Ambulatory Visit: Payer: Self-pay | Admitting: Nurse Practitioner

## 2025-02-18 ENCOUNTER — Ambulatory Visit
